# Patient Record
Sex: Male | Born: 1980 | Hispanic: No | Marital: Married | Smoking: Never smoker
Health system: Southern US, Community
[De-identification: ages and names within clinical notes are randomized; demographics above are authoritative.]

## PROBLEM LIST (undated history)

## (undated) DIAGNOSIS — R011 Cardiac murmur, unspecified: Secondary | ICD-10-CM

## (undated) HISTORY — PX: KNEE ARTHROPLASTY: SHX992

## (undated) HISTORY — PX: WRIST SURGERY: SHX841

## (undated) HISTORY — DX: Cardiac murmur, unspecified: R01.1

## (undated) HISTORY — PX: APPENDECTOMY: SHX54

---

## 2009-08-08 ENCOUNTER — Encounter: Admission: RE | Admit: 2009-08-08 | Discharge: 2009-08-08 | Payer: Self-pay | Admitting: Specialist

## 2017-12-16 NOTE — Progress Notes (Signed)
Subjective:    Patient ID: Henry NianCorey Stuart, male    DOB: 01/19/1981, 36 y.o.   MRN: [id number]  HPI:  Henry Stuart is here to establish as a new pt.  He is a pleasant 36 year old male.  PMH: Denies chronic medical conditions/daily medications.  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 He drinks >60 oz water/day and follows heart healthy diet. He has never used tobacco and enjoys ETOH socially.  He sleeps well and feels that his overall health is excellent. Only one acute complaint- clear nasal drainage that has been occurring for 2 days, he denies HA/fever/night sweats/cough  Patient Care Team    Relationship Specialty Notifications Start End  Danford, Jinny BlossomKaty D, NP PCP - General Family Medicine  12/18/17     There are no active problems to display for this patient.    History reviewed. No pertinent past medical history.   Past Surgical History:  Procedure Laterality Date  . KNEE ARTHROPLASTY Left   . WRIST SURGERY Right      Family History  Problem Relation Age of Onset  . Cancer Mother        GYN  . Cancer Father        esophageal  . Healthy Sister   . Healthy Brother   . Healthy Daughter   . Healthy Son   . Healthy Son   . Healthy Son   . Healthy Sister   . Healthy Sister   . Healthy Sister   . Healthy Sister   . Healthy Sister   . Healthy Brother   . Healthy Brother   . Healthy Brother   . Healthy Brother      Social History   Substance and Sexual Activity  Drug Use No     Social History   Substance and Sexual Activity  Alcohol Use Yes  . Alcohol/week: 3.0 oz  . Types: 5 Cans of beer per week     Social History   Tobacco Use  Smoking Status Never Smoker  Smokeless Tobacco Never Used     No outpatient encounter medications on file as of 12/18/2017.   No  facility-administered encounter medications on file as of 12/18/2017.     Allergies: Patient has no known allergies.  Body mass index is 22.9 kg/m.  Blood pressure (!) 99/56, pulse 69, height 6' 6.25" (1.988 m), weight 199 lb 6.4 oz (90.4 kg), SpO2 98 %.      Review of Systems  Constitutional: Positive for fatigue. Negative for activity change, appetite change, chills, diaphoresis, fever and unexpected weight change.  HENT: Positive for congestion and rhinorrhea. Negative for sinus pressure, sinus pain, trouble swallowing and voice change.   Eyes: Negative for visual disturbance.  Respiratory: Negative for cough, chest tightness, shortness of breath, wheezing and stridor.   Cardiovascular: Negative for chest pain, palpitations and leg swelling.  Gastrointestinal: Negative for abdominal distention, abdominal pain, blood in stool, constipation, diarrhea, nausea and vomiting.  Endocrine: Negative for cold intolerance, heat intolerance, polydipsia, polyphagia and polyuria.  Genitourinary: Negative for difficulty urinating, flank pain and hematuria.  Musculoskeletal: Negative for arthralgias, back pain, gait problem, joint swelling, myalgias, neck pain and neck stiffness.  Skin: Negative for color change, pallor, rash and wound.  Neurological: Negative for dizziness, tremors, weakness and headaches.  Hematological:  Does not bruise/bleed easily.  Psychiatric/Behavioral: Negative for decreased concentration, dysphoric mood, self-injury, sleep disturbance and suicidal ideas. The patient is not nervous/anxious.        Objective:   Physical Exam  Constitutional: He is oriented to person, place, and time. He appears well-developed and well-nourished. No distress.  HENT:  Head: Normocephalic and atraumatic.  Right Ear: Hearing, tympanic membrane, external ear and ear canal normal. Tympanic membrane is not erythematous and not bulging. No decreased hearing is noted.  Left Ear: Hearing,  tympanic membrane, external ear and ear canal normal. Tympanic membrane is not erythematous and not bulging. No decreased hearing is noted.  Nose: Mucosal edema and rhinorrhea present. Right sinus exhibits no maxillary sinus tenderness and no frontal sinus tenderness. Left sinus exhibits no maxillary sinus tenderness and no frontal sinus tenderness.  Mouth/Throat: Uvula is midline, oropharynx is clear and moist and mucous membranes are normal.  Eyes: Conjunctivae are normal. Pupils are equal, round, and reactive to light.  Neck: Normal range of motion. Neck supple.  Cardiovascular: Normal rate, regular rhythm, normal heart sounds and intact distal pulses.  No murmur heard. Pulmonary/Chest: Effort normal. No respiratory distress. He has no wheezes. He has no rales. He exhibits no tenderness.  Lymphadenopathy:    He has no cervical adenopathy.  Neurological: He is alert and oriented to person, place, and time. Coordination normal.  Skin: Skin is warm and dry. No rash noted. He is not diaphoretic. No erythema. No pallor.  Psychiatric: He has a normal mood and affect. His behavior is normal. Judgment and thought content normal.  Nursing note and vitals reviewed.         Assessment & Plan:   1. Healthcare maintenance   2. Sinus drainage     Healthcare maintenance Increase water intake, strive for at least 100 oz/day. KEEP DOING EVERYTHING THAT YOU ARE DOING! Annual follow-up with fasting labs.  Sinus drainage OTC Claritin when allergies/sinus drainage flares up.    FOLLOW-UP:  Return in about 1 year (around 12/18/2018) for CPE, Fasting Labs.

## 2017-12-18 ENCOUNTER — Other Ambulatory Visit: Payer: Self-pay | Admitting: Adult Health

## 2017-12-18 ENCOUNTER — Ambulatory Visit (INDEPENDENT_AMBULATORY_CARE_PROVIDER_SITE_OTHER): Payer: BC Managed Care – PPO | Admitting: Adult Health

## 2017-12-18 ENCOUNTER — Encounter: Payer: Self-pay | Admitting: Adult Health

## 2017-12-18 VITALS — BP 99/56 | HR 69 | Ht 78.25 in | Wt 199.4 lb

## 2017-12-18 DIAGNOSIS — Z0000 Encounter for general adult medical examination without abnormal findings: Secondary | ICD-10-CM | POA: Diagnosis not present

## 2017-12-18 DIAGNOSIS — J3489 Other specified disorders of nose and nasal sinuses: Secondary | ICD-10-CM | POA: Insufficient documentation

## 2017-12-18 NOTE — Assessment & Plan Note (Signed)
Increase water intake, strive for at least 100 oz/day. KEEP DOING EVERYTHING THAT YOU ARE DOING! Annual follow-up with fasting labs.

## 2017-12-18 NOTE — Patient Instructions (Signed)
Heart-Healthy Eating Plan Many factors influence your heart health, including eating and exercise habits. Heart (coronary) risk increases with abnormal blood fat (lipid) levels. Heart-healthy meal planning includes limiting unhealthy fats, increasing healthy fats, and making other small dietary changes. This includes maintaining a healthy body weight to help keep lipid levels within a normal range. What is my plan? Your health care provider recommends that you:  Get no more than ___25___% of the total calories in your daily diet from fat.  Limit your intake of saturated fat to less than ____5___% of your total calories each day.  Limit the amount of cholesterol in your diet to less than ___300___ mg per day.  What types of fat should I choose?  Choose healthy fats more often. Choose monounsaturated and polyunsaturated fats, such as olive oil and canola oil, flaxseeds, walnuts, almonds, and seeds.  Eat more omega-3 fats. Good choices include salmon, mackerel, sardines, tuna, flaxseed oil, and ground flaxseeds. Aim to eat fish at least two times each week.  Limit saturated fats. Saturated fats are primarily found in animal products, such as meats, butter, and cream. Plant sources of saturated fats include palm oil, palm kernel oil, and coconut oil.  Avoid foods with partially hydrogenated oils in them. These contain trans fats. Examples of foods that contain trans fats are stick margarine, some tub margarines, cookies, crackers, and other baked goods. What general guidelines do I need to follow?  Check food labels carefully to identify foods with trans fats or high amounts of saturated fat.  Fill one half of your plate with vegetables and green salads. Eat 4-5 servings of vegetables per day. A serving of vegetables equals 1 cup of raw leafy vegetables,  cup of raw or cooked cut-up vegetables, or  cup of vegetable juice.  Fill one fourth of your plate with whole grains. Look for the word  "whole" as the first word in the ingredient list.  Fill one fourth of your plate with lean protein foods.  Eat 4-5 servings of fruit per day. A serving of fruit equals one medium whole fruit,  cup of dried fruit,  cup of fresh, frozen, or canned fruit, or  cup of 100% fruit juice.  Eat more foods that contain soluble fiber. Examples of foods that contain this type of fiber are apples, broccoli, carrots, beans, peas, and barley. Aim to get 20-30 g of fiber per day.  Eat more home-cooked food and less restaurant, buffet, and fast food.  Limit or avoid alcohol.  Limit foods that are high in starch and sugar.  Avoid fried foods.  Cook foods by using methods other than frying. Baking, boiling, grilling, and broiling are all great options. Other fat-reducing suggestions include: ? Removing the skin from poultry. ? Removing all visible fats from meats. ? Skimming the fat off of stews, soups, and gravies before serving them. ? Steaming vegetables in water or broth.  Lose weight if you are overweight. Losing just 5-10% of your initial body weight can help your overall health and prevent diseases such as diabetes and heart disease.  Increase your consumption of nuts, legumes, and seeds to 4-5 servings per week. One serving of dried beans or legumes equals  cup after being cooked, one serving of nuts equals 1 ounces, and one serving of seeds equals  ounce or 1 tablespoon.  You may need to monitor your salt (sodium) intake, especially if you have high blood pressure. Talk with your health care provider or dietitian to get  more information about reducing sodium. What foods can I eat? Grains  Breads, including French, white, pita, wheat, raisin, rye, oatmeal, and Italian. Tortillas that are neither fried nor made with lard or trans fat. Low-fat rolls, including hotdog and hamburger buns and English muffins. Biscuits. Muffins. Waffles. Pancakes. Light popcorn. Whole-grain cereals. Flatbread.  Melba toast. Pretzels. Breadsticks. Rusks. Low-fat snacks and crackers, including oyster, saltine, matzo, graham, animal, and rye. Rice and pasta, including brown rice and those that are made with whole wheat. Vegetables All vegetables. Fruits All fruits, but limit coconut. Meats and Other Protein Sources Lean, well-trimmed beef, veal, pork, and lamb. Chicken and turkey without skin. All fish and shellfish. Wild duck, rabbit, pheasant, and venison. Egg whites or low-cholesterol egg substitutes. Dried beans, peas, lentils, and tofu.Seeds and most nuts. Dairy Low-fat or nonfat cheeses, including ricotta, string, and mozzarella. Skim or 1% milk that is liquid, powdered, or evaporated. Buttermilk that is made with low-fat milk. Nonfat or low-fat yogurt. Beverages Mineral water. Diet carbonated beverages. Sweets and Desserts Sherbets and fruit ices. Honey, jam, marmalade, jelly, and syrups. Meringues and gelatins. Pure sugar candy, such as hard candy, jelly beans, gumdrops, mints, marshmallows, and small amounts of dark chocolate. Angel food cake. Eat all sweets and desserts in moderation. Fats and Oils Nonhydrogenated (trans-free) margarines. Vegetable oils, including soybean, sesame, sunflower, olive, peanut, safflower, corn, canola, and cottonseed. Salad dressings or mayonnaise that are made with a vegetable oil. Limit added fats and oils that you use for cooking, baking, salads, and as spreads. Other Cocoa powder. Coffee and tea. All seasonings and condiments. The items listed above may not be a complete list of recommended foods or beverages. Contact your dietitian for more options. What foods are not recommended? Grains Breads that are made with saturated or trans fats, oils, or whole milk. Croissants. Butter rolls. Cheese breads. Sweet rolls. Donuts. Buttered popcorn. Chow mein noodles. High-fat crackers, such as cheese or butter crackers. Meats and Other Protein Sources Fatty meats, such  as hotdogs, short ribs, sausage, spareribs, bacon, ribeye roast or steak, and mutton. High-fat deli meats, such as salami and bologna. Caviar. Domestic duck and goose. Organ meats, such as kidney, liver, sweetbreads, brains, gizzard, chitterlings, and heart. Dairy Cream, sour cream, cream cheese, and creamed cottage cheese. Whole milk cheeses, including blue (bleu), Monterey Jack, Brie, Colby, American, Havarti, Swiss, cheddar, Camembert, and [place]. Whole or 2% milk that is liquid, evaporated, or condensed. Whole buttermilk. Cream sauce or high-fat cheese sauce. Yogurt that is made from whole milk. Beverages Regular sodas and drinks with added sugar. Sweets and Desserts Frosting. Pudding. Cookies. Cakes other than angel food cake. Candy that has milk chocolate or white chocolate, hydrogenated fat, butter, coconut, or unknown ingredients. Buttered syrups. Full-fat ice cream or ice cream drinks. Fats and Oils Gravy that has suet, meat fat, or shortening. Cocoa butter, hydrogenated oils, palm oil, coconut oil, palm kernel oil. These can often be found in baked products, candy, fried foods, nondairy creamers, and whipped toppings. Solid fats and shortenings, including bacon fat, salt pork, lard, and butter. Nondairy cream substitutes, such as coffee creamers and sour cream substitutes. Salad dressings that are made of unknown oils, cheese, or sour cream. The items listed above may not be a complete list of foods and beverages to avoid. Contact your dietitian for more information. This information is not intended to replace advice given to you by your health care provider. Make sure you discuss any questions you have with your health care   provider. Document Released: 09/25/2008 Document Revised: 07/06/2016 Document Reviewed: 06/10/2014 Elsevier Interactive Patient Education  2018 [employer]   Increase water intake, strive for at least 100 oz/day. KEEP DOING EVERYTHING THAT YOU ARE DOING! OTC  Claritin when allergies/sinus drainage flares up. Annual follow-up with fasting labs. WELCOME TO THE PRACTICE!

## 2017-12-18 NOTE — Assessment & Plan Note (Signed)
OTC Claritin when allergies/sinus drainage flares up.

## 2018-01-15 ENCOUNTER — Other Ambulatory Visit: Payer: BC Managed Care – PPO

## 2018-01-15 DIAGNOSIS — Z0000 Encounter for general adult medical examination without abnormal findings: Secondary | ICD-10-CM

## 2018-01-16 LAB — COMPREHENSIVE METABOLIC PANEL
A/G RATIO: 1.7 (ref 1.2–2.2)
ALBUMIN: 4.5 g/dL (ref 3.5–5.5)
ALT: 10 IU/L (ref 0–44)
AST: 19 IU/L (ref 0–40)
Alkaline Phosphatase: 49 IU/L (ref 39–117)
BUN / CREAT RATIO: 9 (ref 9–20)
BUN: 13 mg/dL (ref 6–20)
Bilirubin Total: 0.6 mg/dL (ref 0.0–1.2)
CALCIUM: 9.6 mg/dL (ref 8.7–10.2)
CO2: 27 mmol/L (ref 20–29)
Chloride: 100 mmol/L (ref 96–106)
Creatinine, Ser: 1.46 mg/dL — ABNORMAL HIGH (ref 0.76–1.27)
GFR calc Af Amer: 71 mL/min/{1.73_m2} (ref >59)
GFR, EST NON AFRICAN AMERICAN: 61 mL/min/{1.73_m2} (ref >59)
GLOBULIN, TOTAL: 2.7 g/dL (ref 1.5–4.5)
Glucose: 93 mg/dL (ref 65–99)
POTASSIUM: 4.3 mmol/L (ref 3.5–5.2)
SODIUM: 142 mmol/L (ref 134–144)
TOTAL PROTEIN: 7.2 g/dL (ref 6.0–8.5)

## 2018-01-16 LAB — CBC WITH DIFFERENTIAL/PLATELET
Basophils Absolute: 0 10*3/uL (ref 0.0–0.2)
Basos: 0 %
EOS (ABSOLUTE): 0 10*3/uL (ref 0.0–0.4)
Eos: 1 %
HEMATOCRIT: 38.3 % (ref 37.5–51.0)
HEMOGLOBIN: 12.9 g/dL — AB (ref 13.0–17.7)
IMMATURE GRANS (ABS): 0 10*3/uL (ref 0.0–0.1)
Immature Granulocytes: 0 %
LYMPHS ABS: 2.3 10*3/uL (ref 0.7–3.1)
Lymphs: 36 %
MCH: 30.5 pg (ref 26.6–33.0)
MCHC: 33.7 g/dL (ref 31.5–35.7)
MCV: 91 fL (ref 79–97)
MONOCYTES: 6 %
Monocytes Absolute: 0.4 10*3/uL (ref 0.1–0.9)
Neutrophils Absolute: 3.6 10*3/uL (ref 1.4–7.0)
Neutrophils: 57 %
Platelets: 234 10*3/uL (ref 150–379)
RBC: 4.23 x10E6/uL (ref 4.14–5.80)
RDW: 13.2 % (ref 12.3–15.4)
WBC: 6.3 10*3/uL (ref 3.4–10.8)

## 2018-01-16 LAB — LIPID PANEL
CHOLESTEROL TOTAL: 143 mg/dL (ref 100–199)
Chol/HDL Ratio: 2.3 ratio (ref 0.0–5.0)
HDL: 61 mg/dL (ref >39)
LDL CALC: 72 mg/dL (ref 0–99)
TRIGLYCERIDES: 52 mg/dL (ref 0–149)
VLDL Cholesterol Cal: 10 mg/dL (ref 5–40)

## 2018-01-16 LAB — HEMOGLOBIN A1C
Est. average glucose Bld gHb Est-mCnc: 94 mg/dL
Hgb A1c MFr Bld: 4.9 % (ref 4.8–5.6)

## 2018-01-16 LAB — TSH: TSH: 4.47 u[IU]/mL (ref 0.450–4.500)

## 2018-01-16 LAB — VITAMIN D 25 HYDROXY (VIT D DEFICIENCY, FRACTURES): Vit D, 25-Hydroxy: 11.6 ng/mL — ABNORMAL LOW (ref 30.0–100.0)

## 2018-02-10 ENCOUNTER — Other Ambulatory Visit: Payer: Self-pay | Admitting: Adult Health

## 2018-02-10 ENCOUNTER — Telehealth: Payer: Self-pay | Admitting: Adult Health

## 2018-02-10 MED ORDER — VITAMIN D (ERGOCALCIFEROL) 1.25 MG (50000 UNIT) PO CAPS: 50000.0000 [IU] | ORAL_CAPSULE | ORAL | 0 refills | Status: DC

## 2018-02-10 NOTE — Telephone Encounter (Signed)
Patient was inquiring about Vit D prescription that should have been sent in. If approved please send to [facility] [address]

## 2018-02-10 NOTE — Telephone Encounter (Signed)
Sent in Thanks! Henry Stuart  

## 2018-03-13 ENCOUNTER — Encounter: Payer: Self-pay | Admitting: Adult Health

## 2018-03-13 ENCOUNTER — Ambulatory Visit: Payer: BC Managed Care – PPO | Admitting: Adult Health

## 2018-03-13 VITALS — BP 110/67 | HR 84 | Temp 98.0°F | Ht 78.25 in | Wt 198.7 lb

## 2018-03-13 DIAGNOSIS — H6691 Otitis media, unspecified, right ear: Secondary | ICD-10-CM | POA: Diagnosis not present

## 2018-03-13 MED ORDER — AMOXICILLIN-POT CLAVULANATE 875-125 MG PO TABS: 1.0000 | ORAL_TABLET | Freq: Two times a day (BID) | ORAL | 0 refills | Status: DC

## 2018-03-13 NOTE — Progress Notes (Signed)
Subjective:    Patient ID: Henry Stuart, male    DOB: 01-29-1981, 37 y.o.   MRN: [id number]  HPI:  Henry Stuart presents with R ear pain 6/10 that is constant and described as "aching, stabbing" that started 3 weeks ago.  He reports cleaning his ears with Qtip about 4 months ago and "punctured my R ear"- blood oozed out for several days. He denies nasal drainage, fever, CP, dyspnea, palpitations. He denies decreased hearing in R ear He also reports mild R jaw tenderness with, especially with mastication. He denies hx of lacerations TDaP- UTD  Patient Care Team    Relationship Specialty Notifications Start End  Julaine Fusianford, Emon Lance D, NP PCP - General Family Medicine  12/18/17     Patient Active Problem List   Diagnosis Date Noted  . Right otitis media 03/13/2018  . Healthcare maintenance 12/18/2017  . Sinus drainage 12/18/2017     History reviewed. No pertinent past medical history.   Past Surgical History:  Procedure Laterality Date  . KNEE ARTHROPLASTY Left   . WRIST SURGERY Right      Family History  Problem Relation Age of Onset  . Cancer Mother        GYN  . Cancer Father        esophageal  . Healthy Sister   . Healthy Brother   . Healthy Daughter   . Healthy Son   . Healthy Son   . Healthy Son   . Healthy Sister   . Healthy Sister   . Healthy Sister   . Healthy Sister   . Healthy Sister   . Healthy Brother   . Healthy Brother   . Healthy Brother   . Healthy Brother      Social History   Substance and Sexual Activity  Drug Use No     Social History   Substance and Sexual Activity  Alcohol Use Yes  . Alcohol/week: 3.0 oz  . Types: 5 Cans of beer per week     Social History   Tobacco Use  Smoking Status Never Smoker  Smokeless Tobacco Never Used     Outpatient Encounter Medications as of 03/13/2018  Medication Sig  . Vitamin D, Ergocalciferol, (DRISDOL) 50000 units CAPS capsule Take 1 capsule (50,000 Units total) by mouth every 7  (seven) days.  Marland Kitchen. amoxicillin-clavulanate (AUGMENTIN) 875-125 MG tablet Take 1 tablet by mouth 2 (two) times daily.   No facility-administered encounter medications on file as of 03/13/2018.     Allergies: Patient has no known allergies.  Body mass index is 22.82 kg/m.  Blood pressure 110/67, pulse 84, temperature 98 F (36.7 C), temperature source Oral, height 6' 6.25" (1.988 m), weight 198 lb 11.2 oz (90.1 kg), SpO2 99 %.     Review of Systems  Constitutional: Positive for fatigue. Negative for activity change, appetite change, chills, diaphoresis, fever and unexpected weight change.  HENT: Positive for ear discharge and ear pain. Negative for congestion, facial swelling, nosebleeds, postnasal drip, rhinorrhea, sinus pressure, sinus pain, tinnitus, trouble swallowing and voice change.   Eyes: Negative for visual disturbance.  Respiratory: Negative for cough, chest tightness, shortness of breath, wheezing and stridor.   Cardiovascular: Negative for chest pain, palpitations and leg swelling.  Neurological: Negative for dizziness and headaches.  Hematological: Does not bruise/bleed easily.       Objective:   Physical Exam  Constitutional: He is oriented to person, place, and time. He appears well-developed and well-nourished. No distress.  HENT:  Head: Normocephalic and atraumatic.  Right Ear: Hearing, external ear and ear canal normal. Right ear exhibits lacerations. Tympanic membrane is erythematous and bulging. No decreased hearing is noted.  Left Ear: Hearing, tympanic membrane, external ear and ear canal normal. Tympanic membrane is not erythematous and not bulging. No decreased hearing is noted.  Ears:  Healing laceration No purulent drainage noted  Eyes: Conjunctivae are normal. Pupils are equal, round, and reactive to light.  Neck: Normal range of motion. Neck supple.  Cardiovascular: Normal rate, regular rhythm, normal heart sounds and intact distal pulses.  No murmur  heard. Pulmonary/Chest: Effort normal and breath sounds normal. No respiratory distress. He has no wheezes. He has no rales. He exhibits no tenderness.  Lymphadenopathy:    He has no cervical adenopathy.  Neurological: He is alert and oriented to person, place, and time.  Skin: Skin is warm and dry. No rash noted. He is not diaphoretic. No erythema. No pallor.  Psychiatric: He has a normal mood and affect. His behavior is normal. Judgment and thought content normal.  Nursing note and vitals reviewed.         Assessment & Plan:   1. Right otitis media, unspecified otitis media type     Right otitis media Please take Augmentin as directed. Alternate OTC Acetaminophen and Ibuprofen for pain. If symptoms persist after Augmentin completed, then please call clinic. Do not submerge head under water for a few weeks.    FOLLOW-UP:  Return if symptoms worsen or fail to improve.

## 2018-03-13 NOTE — Assessment & Plan Note (Addendum)
Please take Augmentin as directed. Alternate OTC Acetaminophen and Ibuprofen for pain. If symptoms persist after Augmentin completed, then please call clinic. Do not submerge head under water for a few weeks.

## 2018-03-13 NOTE — Patient Instructions (Addendum)
Otitis Media, Adult Otitis media occurs when there is inflammation and fluid in the middle ear. Your middle ear is a part of the ear that contains bones for hearing as well as air that helps send sounds to your brain. What are the causes? This condition is caused by a blockage in the eustachian tube. This tube drains fluid from the ear to the back of the nose (nasopharynx). A blockage in this tube can be caused by an object or by swelling (edema) in the tube. Problems that can cause a blockage include:  A cold or other upper respiratory infection.  Allergies.  An irritant, such as tobacco smoke.  Enlarged adenoids. The adenoids are areas of soft tissue located high in the back of the throat, behind the nose and the roof of the mouth.  A mass in the nasopharynx.  Damage to the ear caused by pressure changes (barotrauma). What are the signs or symptoms? Symptoms of this condition include:  Ear pain.  A fever.  Decreased hearing.  A headache.  Tiredness (lethargy).  Fluid leaking from the ear.  Ringing in the ear. How is this diagnosed? This condition is diagnosed with a physical exam. During the exam your health care provider will use an instrument called an otoscope to look into your ear and check for redness, swelling, and fluid. He or she will also ask about your symptoms. Your health care provider may also order tests, such as:  A test to check the movement of the eardrum (pneumatic otoscopy). This test is done by squeezing a small amount of air into the ear.  A test that changes air pressure in the middle ear to check how well the eardrum moves and whether the eustachian tube is working (tympanogram). How is this treated? This condition usually goes away on its own within 3-5 days. But if the condition is caused by a bacteria infection and does not go away own its own, or keeps coming back, your health care provider may:  Prescribe antibiotic medicines to treat the  infection.  Prescribe or recommend medicines to control pain. Follow these instructions at home:  Take over-the-counter and prescription medicines only as told by your health care provider.  If you were prescribed an antibiotic medicine, take it as told by your health care provider. Do not stop taking the antibiotic even if you start to feel better.  Keep all follow-up visits as told by your health care provider. This is important. Contact a health care provider if:  You have bleeding from your nose.  There is a lump on your neck.  You are not getting better in 5 days.  You feel worse instead of better. Get help right away if:  You have severe pain that is not controlled with medicine.  You have swelling, redness, or pain around your ear.  You have stiffness in your neck.  A part of your face is paralyzed.  The bone behind your ear (mastoid) is tender when you touch it.  You develop a severe headache. Summary  Otitis media is redness, soreness, and swelling of the middle ear.  This condition usually goes away on its own within 3-5 days.  If the problem does not go away in 3-5 days, your health care provider may prescribe or recommend medicines to treat your symptoms.  If you were prescribed an antibiotic medicine, take it as told by your health care provider. This information is not intended to replace advice given to you by your   to you by your health care provider. Make sure you discuss any questions you have with your health care provider. Document Released: 09/21/2004 Document Revised: 12/07/2016 Document Reviewed: 12/07/2016 Elsevier Interactive Patient Education  2018 [employer]   Please take Augmentin as directed. Alternate OTC Acetaminophen and Ibuprofen for pain. If symptoms persist after Augmentin completed, then please call clinic. Do not submerge head under water for a few weeks. FEEL BETTER!

## 2018-03-27 ENCOUNTER — Encounter: Payer: Self-pay | Admitting: Adult Health

## 2018-03-27 ENCOUNTER — Ambulatory Visit (INDEPENDENT_AMBULATORY_CARE_PROVIDER_SITE_OTHER): Payer: BC Managed Care – PPO | Admitting: Adult Health

## 2018-03-27 VITALS — BP 97/61 | HR 54 | Temp 97.7°F | Ht 78.25 in | Wt 201.4 lb

## 2018-03-27 DIAGNOSIS — R6884 Jaw pain: Secondary | ICD-10-CM

## 2018-03-27 DIAGNOSIS — M26629 Arthralgia of temporomandibular joint, unspecified side: Secondary | ICD-10-CM | POA: Insufficient documentation

## 2018-03-27 DIAGNOSIS — H6691 Otitis media, unspecified, right ear: Secondary | ICD-10-CM | POA: Diagnosis not present

## 2018-03-27 NOTE — Patient Instructions (Addendum)
Temporomandibular Joint Syndrome Temporomandibular joint (TMJ) syndrome is a condition that affects the joints between your jaw and your skull. The TMJs are located near your ears and allow your jaw to open and close. These joints and the nearby muscles are involved in all movements of the jaw. People with TMJ syndrome have pain in the area of these joints and muscles. Chewing, biting, or other movements of the jaw can be difficult or painful. TMJ syndrome can be caused by various things. In many cases, the condition is mild and goes away within a few weeks. For some people, the condition can become a long-term problem. What are the causes? Possible causes of TMJ syndrome include:  Grinding your teeth or clenching your jaw. Some people do this when they are under stress.  Arthritis.  Injury to the jaw.  Head or neck injury.  Teeth or dentures that are not aligned well.  In some cases, the cause of TMJ syndrome may not be known. What are the signs or symptoms? The most common symptom is an aching pain on the side of the head in the area of the TMJ. Other symptoms may include:  Pain when moving your jaw, such as when chewing or biting.  Being unable to open your jaw all the way.  Making a clicking sound when you open your mouth.  Headache.  Earache.  Neck or shoulder pain.  How is this diagnosed? Diagnosis can usually be made based on your symptoms, your medical history, and a physical exam. Your health care provider may check the range of motion of your jaw. Imaging tests, such as X-rays or an MRI, are sometimes done. You may need to see your dentist to determine if your teeth and jaw are lined up correctly. How is this treated? TMJ syndrome often goes away on its own. If treatment is needed, the options may include:  Eating soft foods and applying ice or heat.  Medicines to relieve pain or inflammation.  Medicines to relax the muscles.  A splint, bite plate, or mouthpiece  to prevent teeth grinding or jaw clenching.  Relaxation techniques or counseling to help reduce stress.  Transcutaneous electrical nerve stimulation (TENS). This helps to relieve pain by applying an electrical current through the skin.  Acupuncture. This is sometimes helpful to relieve pain.  Jaw surgery. This is rarely needed.  Follow these instructions at home:  Take medicines only as directed by your health care provider.  Eat a soft diet if you are having trouble chewing.  Apply ice to the painful area. ? Put ice in a plastic bag. ? Place a towel between your skin and the bag. ? Leave the ice on for 20 minutes, 2-3 times a day.  Apply a warm compress to the painful area as directed.  Massage your jaw area and perform any jaw stretching exercises as recommended by your health care provider.  If you were given a mouthpiece or bite plate, wear it as directed.  Avoid foods that require a lot of chewing. Do not chew gum.  Keep all follow-up visits as directed by your health care provider. This is important. Contact a health care provider if:  You are having trouble eating.  You have new or worsening symptoms. Get help right away if:  Your jaw locks open or closed. This information is not intended to replace advice given to you by your health care provider. Make sure you discuss any questions you have with your health care provider. Document   Released: 09/11/2001 Document Revised: 08/16/2016 Document Reviewed: 07/22/2014 Elsevier Interactive Patient Education  2018 [employer]  Recommend alternating Acetaminophen and Ibuprofen for pain control. Continue to avoid q tip use. Ultrasound order and referral to ENT placed. Please call clinic with any questions/concerns. FEEL BETTER!

## 2018-03-27 NOTE — Assessment & Plan Note (Signed)
>>  ASSESSMENT AND PLAN FOR PAIN IN UPPER JAW WRITTEN ON 03/27/2018  1:56 PM BY DANFORD, KATY D, NP  Poss TMJ Follow care instructions and US soft tissue head/neck placed Referral to ENT placed

## 2018-03-27 NOTE — Assessment & Plan Note (Signed)
He completed course of ABX Continue to avoid q tip use Referral to ENT placed
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
## 2018-03-27 NOTE — Assessment & Plan Note (Signed)
Poss TMJ Follow care instructions and US soft tissue head/neck placed Referral to ENT placed

## 2018-04-01 ENCOUNTER — Other Ambulatory Visit: Payer: BC Managed Care – PPO

## 2018-04-01 DIAGNOSIS — M26629 Arthralgia of temporomandibular joint, unspecified side: Secondary | ICD-10-CM | POA: Insufficient documentation

## 2018-05-10 ENCOUNTER — Other Ambulatory Visit: Payer: Self-pay | Admitting: Adult Health

## 2018-05-12 ENCOUNTER — Telehealth: Payer: Self-pay

## 2018-05-12 NOTE — Telephone Encounter (Signed)
LVM for pt that he needs repeat labs prior to refills of Vitamin D2.  Tiajuana Amass, CMA

## 2018-09-29 ENCOUNTER — Ambulatory Visit: Payer: BC Managed Care – PPO

## 2018-09-29 ENCOUNTER — Encounter: Payer: Self-pay | Admitting: Adult Health

## 2018-09-29 ENCOUNTER — Ambulatory Visit (INDEPENDENT_AMBULATORY_CARE_PROVIDER_SITE_OTHER): Payer: BC Managed Care – PPO | Admitting: Adult Health

## 2018-09-29 VITALS — BP 115/75 | HR 77 | Ht 78.25 in | Wt 202.2 lb

## 2018-09-29 DIAGNOSIS — M25412 Effusion, left shoulder: Secondary | ICD-10-CM | POA: Insufficient documentation

## 2018-09-29 DIAGNOSIS — T148XXA Other injury of unspecified body region, initial encounter: Secondary | ICD-10-CM | POA: Insufficient documentation

## 2018-09-29 DIAGNOSIS — M25512 Pain in left shoulder: Principal | ICD-10-CM

## 2018-09-29 DIAGNOSIS — Z23 Encounter for immunization: Secondary | ICD-10-CM | POA: Diagnosis not present

## 2018-09-29 MED ORDER — CEPHALEXIN 500 MG PO CAPS: 500.0000 mg | ORAL_CAPSULE | Freq: Two times a day (BID) | ORAL | 0 refills | Status: DC
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
## 2018-09-29 NOTE — Progress Notes (Signed)
tdap

## 2018-09-29 NOTE — Assessment & Plan Note (Addendum)
Xray L shoulder complete- IMPRESSION: Normal exam. OTC Ibuprofen 800mg  Q8H with food Apply ice to shoulder for 20 mins several times daily Limit use for several weeks until swelling/pain resolve

## 2018-09-29 NOTE — Assessment & Plan Note (Addendum)
Abrasion of multiple area's- all cleaned, antibiotic cream applied, wounds dressed. Cephalexin 500mg  BID x 10 days Tdap updated in office, pt tolerated well

## 2018-09-29 NOTE — Patient Instructions (Signed)

## 2018-12-12 NOTE — Progress Notes (Deleted)
   Subjective:    Patient ID: Henry Stuart, male    DOB: [birth year], 37 y.o.   MRN: [id number]  HPI:  Mr. Park LiterMuirhead presents for CPE  Healthcare Maintenance: Immunizations-  Patient Care Team    Relationship Specialty Notifications Start End  Julaine Fusianford, Jalen Oberry D, NP PCP - General Family Medicine  12/18/17     Patient Active Problem List   Diagnosis Date Noted  . Abrasion 09/29/2018  . Pain and swelling of left shoulder 09/29/2018  . Pain in upper jaw 03/27/2018  . Right otitis media 03/13/2018  . Healthcare maintenance 12/18/2017  . Sinus drainage 12/18/2017     No past medical history on file.   Past Surgical History:  Procedure Laterality Date  . KNEE ARTHROPLASTY Left   . WRIST SURGERY Right      Family History  Problem Relation Age of Onset  . Cancer Mother        GYN  . Cancer Father        esophageal  . Healthy Sister   . Healthy Brother   . Healthy Daughter   . Healthy Son   . Healthy Son   . Healthy Son   . Healthy Sister   . Healthy Sister   . Healthy Sister   . Healthy Sister   . Healthy Sister   . Healthy Brother   . Healthy Brother   . Healthy Brother   . Healthy Brother      Social History   Substance and Sexual Activity  Drug Use No     Social History   Substance and Sexual Activity  Alcohol Use Yes  . Alcohol/week: 5.0 standard drinks  . Types: 5 Cans of beer per week     Social History   Tobacco Use  Smoking Status Never Smoker  Smokeless Tobacco Never Used     Outpatient Encounter Medications as of 12/16/2018  Medication Sig  . cephALEXin (KEFLEX) 500 MG capsule Take 1 capsule (500 mg total) by mouth 2 (two) times daily.   No facility-administered encounter medications on file as of 12/16/2018.     Allergies: Patient has no known allergies.  There is no height or weight on file to calculate BMI.  There were no vitals taken for this visit.     Review of Systems     Objective:   Physical Exam       Assessment & Plan:  No diagnosis found.  No problem-specific Assessment & Plan notes found for this encounter.    FOLLOW-UP:  No follow-ups on file.

## 2018-12-15 ENCOUNTER — Other Ambulatory Visit: Payer: Self-pay

## 2018-12-15 DIAGNOSIS — Z0000 Encounter for general adult medical examination without abnormal findings: Secondary | ICD-10-CM

## 2018-12-16 ENCOUNTER — Encounter: Payer: BC Managed Care – PPO | Admitting: Adult Health

## 2021-09-25 ENCOUNTER — Ambulatory Visit: Payer: Self-pay

## 2021-09-25 ENCOUNTER — Encounter: Payer: Self-pay | Admitting: Family Medicine

## 2021-09-25 ENCOUNTER — Ambulatory Visit (INDEPENDENT_AMBULATORY_CARE_PROVIDER_SITE_OTHER): Payer: BC Managed Care – PPO

## 2021-09-25 ENCOUNTER — Other Ambulatory Visit: Payer: Self-pay

## 2021-09-25 ENCOUNTER — Ambulatory Visit: Payer: BC Managed Care – PPO | Admitting: Family Medicine

## 2021-09-25 VITALS — BP 100/62 | HR 77 | Ht 78.25 in | Wt 196.6 lb

## 2021-09-25 DIAGNOSIS — M79641 Pain in right hand: Secondary | ICD-10-CM

## 2021-09-25 DIAGNOSIS — M79644 Pain in right finger(s): Secondary | ICD-10-CM

## 2021-09-25 NOTE — Patient Instructions (Signed)
Nice to meet you today.  I've referred you to hand PT at [facility].  Please let us know if you've not heard from them in one week regarding scheduling.  Please use Voltaren gel (Generic Diclofenac Gel) up to 4x daily for pain as needed.  This is available over-the-counter as both the name brand Voltaren gel and the generic diclofenac gel.   Follow-up in one month.

## 2021-09-25 NOTE — Progress Notes (Signed)
Subjective:    CC: R 3rd finger pain  I, Henry Stuart, LAT, ATC, am serving as scribe for Dr. Clementeen Stuart.  HPI: Pt is a 40 y/o male presenting w/ c/o R 3rd finger pain since last Wednesday, 09/20/21, when he injured it while playing basketball.  He locates his pain to his R 3rd MCP and R 3rd finger PIP joint.  R finger swelling: yes Aggravating factors: R hand and finger flexion; gripping; R middle finger movement M/L Treatments tried: ice  Pertinent review of Systems: No fevers or chills  Relevant historical information: Prior wrist surgery right side.   Objective:    Vitals:   09/25/21 1017  BP: 100/62  Pulse: 77  SpO2: 96%   General: Well Developed, well nourished, and in no acute distress.   MSK: Right hand swollen and third MCP and PIP. Decreased motion MCP and PIP with pain. Pulses cap refill and sensation are intact distally.  Lab and Radiology Results  Diagnostic Limited MSK Ultrasound of: Right hand third MCP and PIP Third MCP: Mild effusion no acute fractures.  Intact flexor and extensor tendons. Third PIP.  Mild effusion no acute fractures.  Intact flexor and extensor tendons. Impression: Acute fractures.  Mild joint effusion third MCP and PIP.  X-ray images right hand obtained today personally and independently interpreted. No acute fractures visible. Evidence of prior surgery wrist present. Await formal radiology review   Impression and Recommendations:    Assessment and Plan: 40 y.o. male with injury right hand to the third PIP and MCP.  Thought to be traumatic synovitis.  This is effectively a badly jammed finger per my interpretation of x-ray and ultrasound. 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  This is a lot worse than his usual.  I think he is going to have a hard time recovering on his own.  We will immobilize the PIP and MCP with buddy tape and dorsal AlumaFoam  splint and referred to hand PT.  Also recommend oral max dose NSAID and topical Voltaren gel NSAIDs.  Recheck in 1 month.  PDMP not reviewed this encounter. Orders Placed This Encounter  Procedures   [place] LIMITED JOINT SPACE STRUCTURES UP RIGHT(NO LINKED CHARGES)    Order Specific Question:   Reason for Exam (SYMPTOM  OR DIAGNOSIS REQUIRED)    Answer:   R hand pain    Order Specific Question:   Preferred imaging location?    Answer:   [facility] Hand Complete Right    Standing Status:   Future    Number of Occurrences:   1    Standing Expiration Date:   10/25/2021    Order Specific Question:   Reason for Exam (SYMPTOM  OR DIAGNOSIS REQUIRED)    Answer:   R hand pain    Order Specific Question:   Preferred imaging location?    Answer:   Henry Stuart   Ambulatory referral to Physical Therapy    Referral Priority:   Routine    Referral Type:   Physical Medicine    Referral Reason:   Specialty Services Required    Requested Specialty:   Physical Therapy    Number of Visits Requested:   1   No orders of the defined types were placed in this encounter.   Discussed warning signs or symptoms. Please see discharge instructions. Patient expresses understanding.   The above  documentation has been reviewed and is accurate and complete Henry Stuart, M.D.

## 2021-09-26 NOTE — Progress Notes (Signed)
No fracture seen at the middle finger.  It looks like there is an old injury at the ring finger.  We can also see evidence of prior wrist surgery.

## 2021-10-23 NOTE — Progress Notes (Deleted)
   I, Christoper Fabian, LAT, ATC, am serving as scribe for Dr. Clementeen Graham.  Henry Stuart is a 40 y.o. male who presents to [facility] at [facility] today for f/u of R 3rd finger pain that he injured while playing basketball on 09/20/21.  He was last seen by Dr. Denyse Amass on 09/25/21 and was placed in an alumafoam splint and advised to buddy tape his finger. He was also referred to hand therapy at [facility].  Today, pt reports   Diagnostic testing: R hand XR- 09/25/21  Pertinent review of systems: ***  Relevant historical information: ***   Exam:  There were no vitals taken for this visit. General: Well Developed, well nourished, and in no acute distress.   MSK: ***    Lab and Radiology Results No results found for this or any previous visit (from the past 72 hour(s)). No results found.     Assessment and Plan: 40 y.o. male with ***   PDMP not reviewed this encounter. No orders of the defined types were placed in this encounter.  No orders of the defined types were placed in this encounter.    Discussed warning signs or symptoms. Please see discharge instructions. Patient expresses understanding.   ***

## 2021-10-24 ENCOUNTER — Ambulatory Visit: Payer: Self-pay | Admitting: Family Medicine

## 2022-04-21 IMAGING — DX DG HAND COMPLETE 3+V*R*
3 series · 3 of 3 positions shown · non-contrast
Comparison: No prior.

CLINICAL DATA: Right hand pain after playing basketball.

EXAM:
RIGHT HAND - COMPLETE 3+ VIEW

[hand ap]
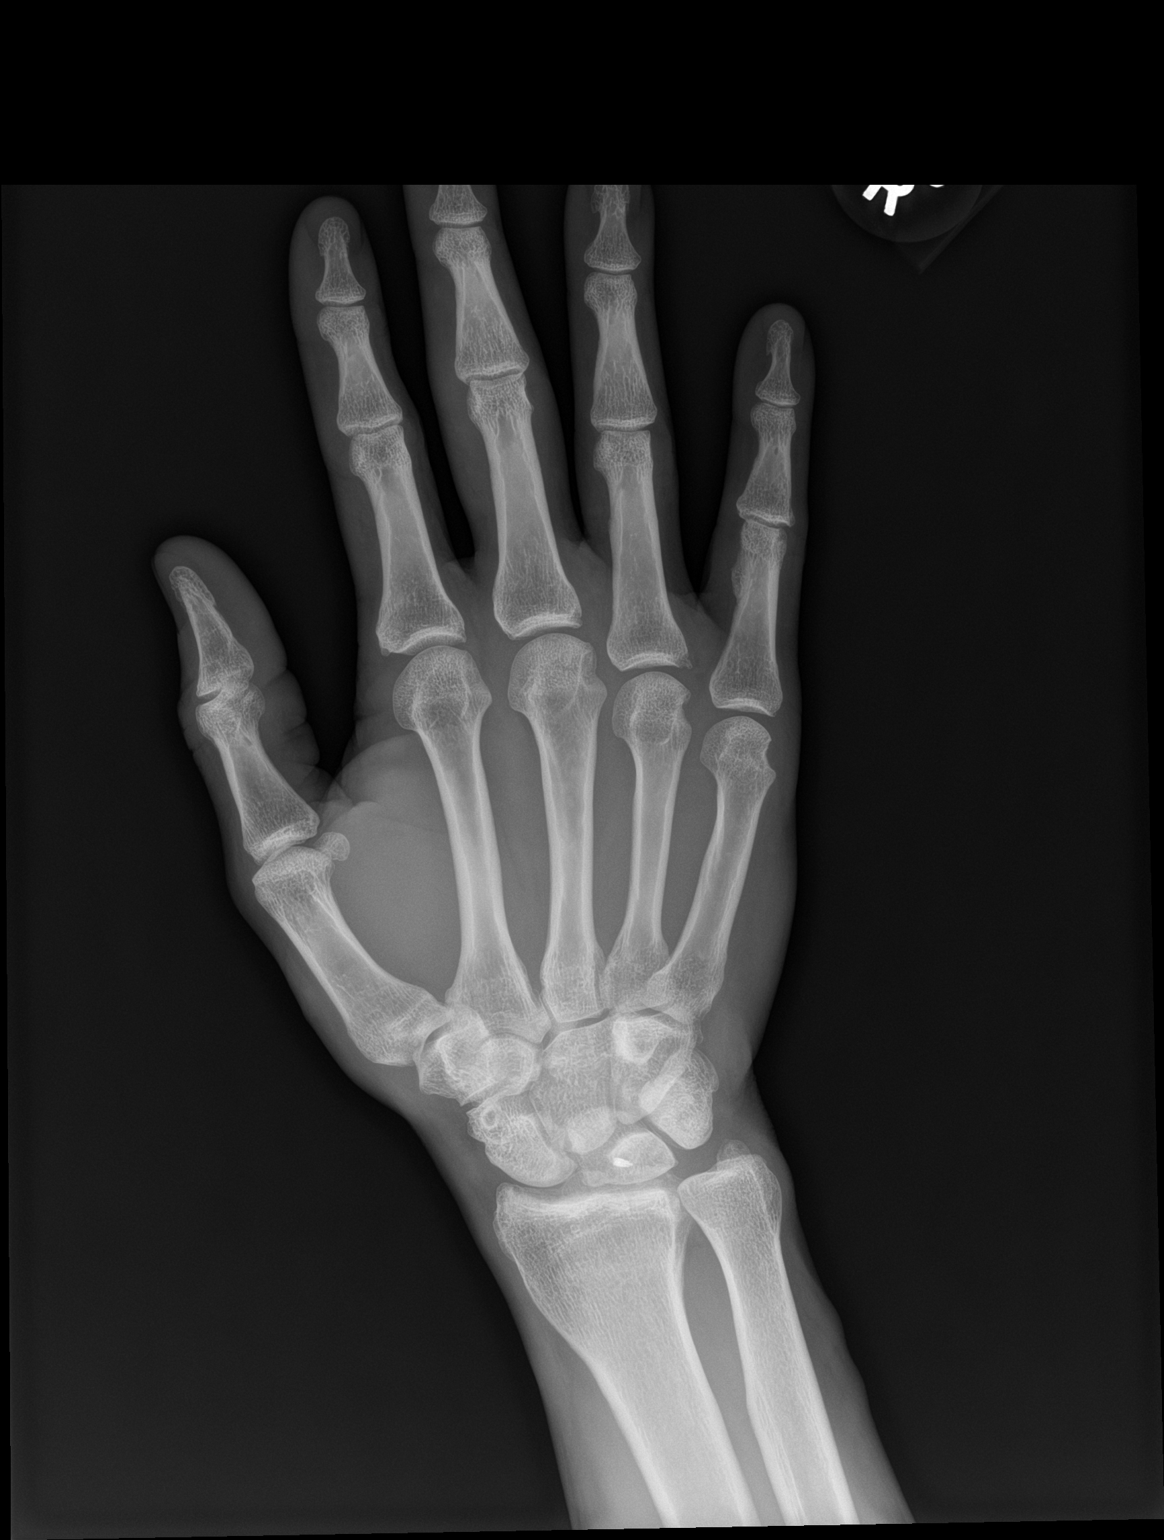

[hand obl]
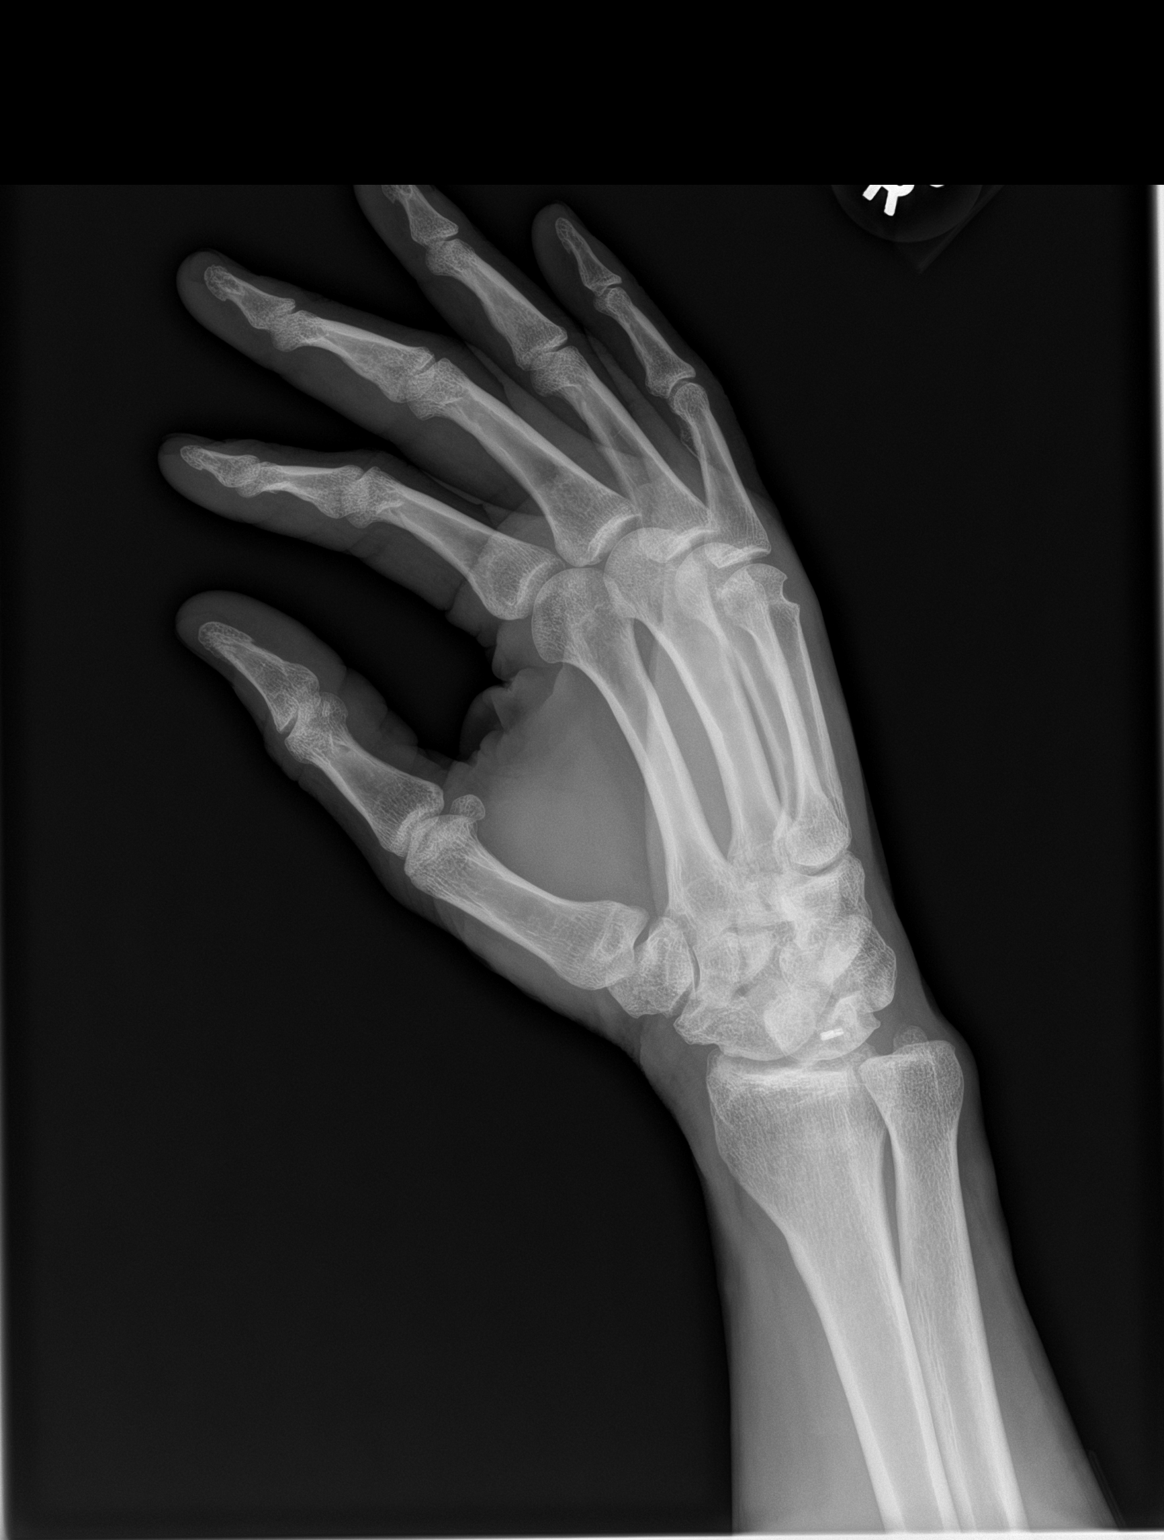

[hand lat]
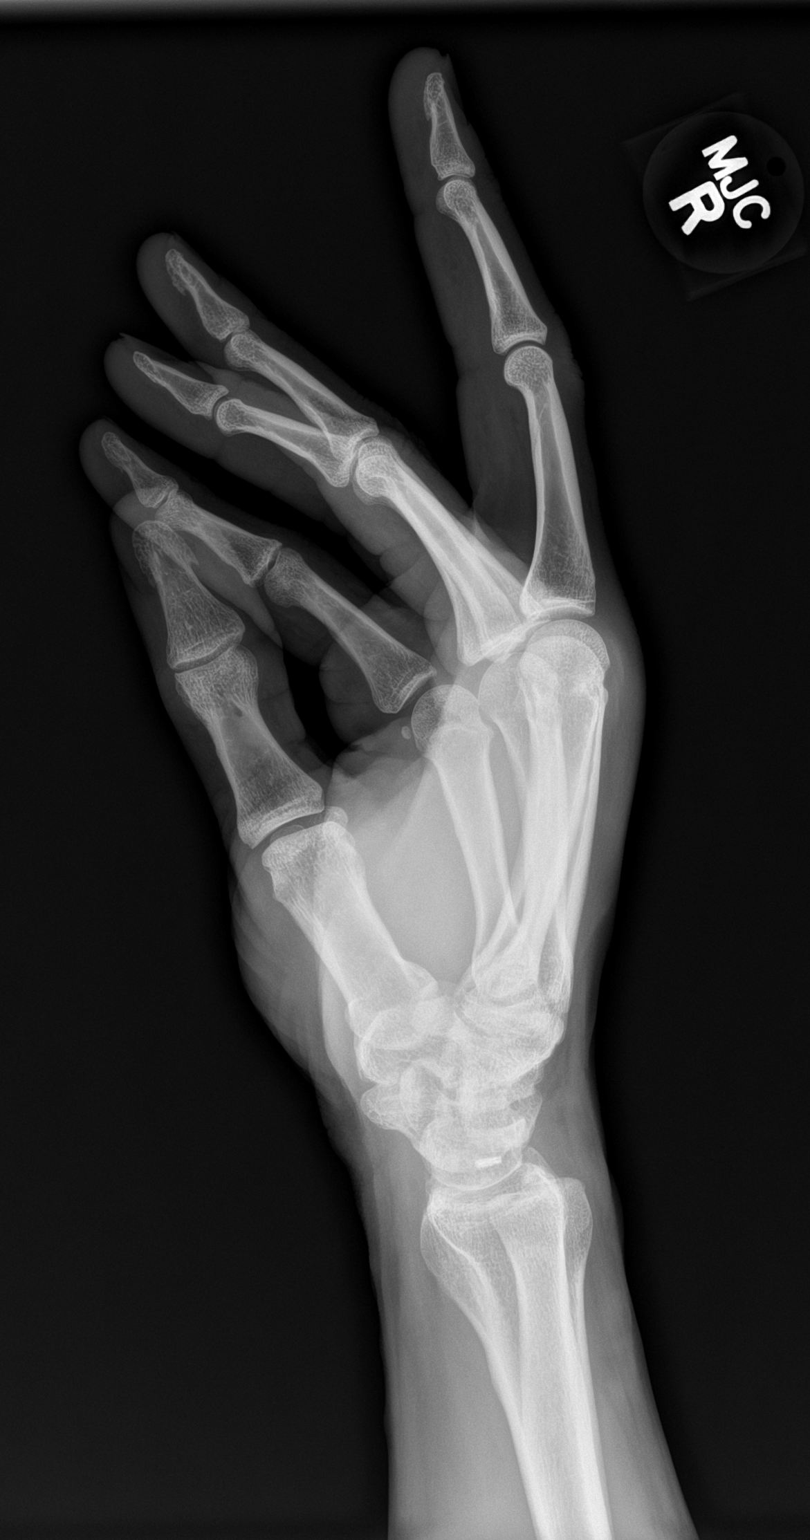

[3 of 3 positions shown; findings below may reference images not displayed]

FINDINGS: A small metallic density is noted within the lunate. Diffuse
degenerative change, most prominent about the radiocarpal joint.
Tiny corticated bony density noted the base of the proximal phalanx
of the right fourth digit. This most likely represents an old
fracture fragment. No acute bony or joint abnormality otherwise
identified. Soft tissues unremarkable.
IMPRESSION: 1.  Small metallic density noted within the lunate.

2. Diffuse degenerative change, most prominent about the radiocarpal
joint.

3. Tiny corticated bony density noted the base of the proximal
phalanx of the right fourth digit. This most likely represents an
old fracture fragment. No acute bony or joint abnormality otherwise
identified.

## 2022-05-16 ENCOUNTER — Ambulatory Visit: Payer: BC Managed Care – PPO | Admitting: Physician Assistant

## 2022-05-16 ENCOUNTER — Encounter: Payer: Self-pay | Admitting: Physician Assistant

## 2022-05-16 VITALS — BP 100/60 | HR 60 | Temp 97.7°F | Ht 78.0 in | Wt 199.0 lb

## 2022-05-16 DIAGNOSIS — Z7689 Persons encountering health services in other specified circumstances: Secondary | ICD-10-CM | POA: Diagnosis not present

## 2022-05-16 DIAGNOSIS — B354 Tinea corporis: Secondary | ICD-10-CM

## 2022-05-16 MED ORDER — TERBINAFINE HCL 250 MG PO TABS: 250.0000 mg | ORAL_TABLET | Freq: Every day | ORAL | 0 refills | Status: AC

## 2022-05-16 MED ORDER — CLOTRIMAZOLE 1 % EX CREA: 1.0000 "application " | TOPICAL_CREAM | Freq: Two times a day (BID) | CUTANEOUS | 0 refills | Status: DC

## 2022-05-16 NOTE — Progress Notes (Signed)
? ?New Patient Office Visit ? ?Subjective   ? ?Patient ID: Henry Stuart, male    DOB: [birth year]  Age: 41 y.o. MRN: [id number] ? ?CC:  ?Chief Complaint  ?Patient presents with  ? New Patient (Initial Visit)  ? ? ?HPI ?Henry Stuart presents to re-establish care. Patient denies new medical dx or surgeries since last visit in 2019. Patient does not take medications or supplements. States has seen dermatology in the past for skin lesions and completed an oral pill which helped. States was prescribed a cream but never got the cream. Patient has never been a smoker. Drinks 2 cups of caffeine daily. Exercise regimen includes running and weights 90 minutes daily  ? ? ?Outpatient Encounter Medications as of 05/16/2022  ?Medication Sig  ? clotrimazole (CLOTRIMAZOLE ANTI-FUNGAL) 1 % cream Apply 1 application. topically 2 (two) times daily.  ? terbinafine (LAMISIL) 250 MG tablet Take 1 tablet (250 mg total) by mouth daily for 14 days.  ? ?No facility-administered encounter medications on file as of 05/16/2022.  ? ? ?History reviewed. No pertinent past medical history. ? ?Past Surgical History:  ?Procedure Laterality Date  ? KNEE ARTHROPLASTY Left   ? WRIST SURGERY Right   ? ? ?Family History  ?Problem Relation Age of Onset  ? Cancer Mother   ?     GYN  ? Cancer Father   ?     esophageal  ? Healthy Sister   ? Healthy Brother   ? Healthy Daughter   ? Healthy Son   ? Healthy Son   ? Healthy Son   ? Healthy Sister   ? Healthy Sister   ? Healthy Sister   ? Healthy Sister   ? Healthy Sister   ? Healthy Brother   ? Healthy Brother   ? Healthy Brother   ? Healthy Brother   ? ? ?Social History  ? ?Socioeconomic History  ? Marital status: Married  ?  Spouse name: Not on file  ? Number of children: Not on file  ? Years of education: Not on file  ? Highest education level: Not on file  ?Occupational History  ? Not on file  ?Tobacco Use  ? Smoking status: Never  ? Smokeless tobacco: Never  ?Vaping Use  ? Vaping Use: Never used  ?Substance  and Sexual Activity  ? Alcohol use: Yes  ?  Alcohol/week: 5.0 standard drinks  ?  Types: 5 Cans of beer per week  ? Drug use: No  ? Sexual activity: Yes  ?  Birth control/protection: Pill  ?Other Topics Concern  ? Not on file  ?Social History Narrative  ? Not on file  ? ?Social Determinants of Health  ? ?Financial Resource Strain: Not on file  ?Food Insecurity: Not on file  ?Transportation Needs: Not on file  ?Physical Activity: Not on file  ?Stress: Not on file  ?Social Connections: Not on file  ?Intimate Partner Violence: Not on file  ? ? ?ROS ?Review of Systems:  ?A fourteen system review of systems was performed and found to be positive as per HPI. ?  ? ? ?Objective   ? ?BP 100/60   Pulse 60   Temp 97.7 ?F (36.5 ?C)   Ht 6\' 6"  (1.981 m)   Wt 199 lb (90.3 kg)   SpO2 98%   BMI 23.00 kg/m?  ? ?Physical Exam ?General:  Well Developed, well nourished, appropriate for stated age.  ?Neuro:  Alert and oriented,  extra-ocular muscles intact  ?HEENT:  Normocephalic,  atraumatic, neck supple ?Skin:  several gray lesions with central hypopigmentation scattered over his back ?Cardiac:  RRR, S1 S2 ?Respiratory:  ECTA B/L and A/P, Not using accessory muscles, speaking in full sentences- unlabored. ?Vascular:  Ext warm, no cyanosis apprec.; cap RF less 2 sec. ?Psych:  No HI/SI, judgement and insight good, Euthymic mood. Full Affect. ? ? ?  ? ?Assessment & Plan:  ? ?Problem List Items Addressed This Visit   ?None ?Visit Diagnoses   ? ? Tinea corporis    -  Primary  ? Relevant Medications  ? clotrimazole (CLOTRIMAZOLE ANTI-FUNGAL) 1 % cream  ? terbinafine (LAMISIL) 250 MG tablet  ? Encounter to establish care      ? ?  ? ?Encounter to establish care: ?-Discussed with patient has s/sx associated with tinea corporis. Will start oral antifungal with terbinafine 250 mg daily x 2 weeks. Advised can use topical clotrimazole 1% BID.  ?-Recommend to schedule CPE and FBW.  ? ?Return for CPE with FBW few days before in 4-6 weeks .   ? ?Mayer Masker, PA-C ? ? ?

## 2022-05-16 NOTE — Patient Instructions (Signed)

## 2022-06-04 ENCOUNTER — Other Ambulatory Visit: Payer: BC Managed Care – PPO

## 2022-06-04 DIAGNOSIS — Z0000 Encounter for general adult medical examination without abnormal findings: Secondary | ICD-10-CM

## 2022-06-04 DIAGNOSIS — Z130 Encounter for screening for diseases of the blood and blood-forming organs and certain disorders involving the immune mechanism: Secondary | ICD-10-CM

## 2022-06-05 LAB — CBC WITH DIFFERENTIAL/PLATELET
Basophils Absolute: 0.1 10*3/uL (ref 0.0–0.2)
Basos: 1 %
EOS (ABSOLUTE): 0.1 10*3/uL (ref 0.0–0.4)
Eos: 2 %
Hematocrit: 38 % (ref 37.5–51.0)
Hemoglobin: 12.8 g/dL — ABNORMAL LOW (ref 13.0–17.7)
Immature Grans (Abs): 0 10*3/uL (ref 0.0–0.1)
Immature Granulocytes: 0 %
Lymphocytes Absolute: 1.9 10*3/uL (ref 0.7–3.1)
Lymphs: 35 %
MCH: 31.2 pg (ref 26.6–33.0)
MCHC: 33.7 g/dL (ref 31.5–35.7)
MCV: 93 fL (ref 79–97)
Monocytes Absolute: 0.4 10*3/uL (ref 0.1–0.9)
Monocytes: 7 %
Neutrophils Absolute: 3 10*3/uL (ref 1.4–7.0)
Neutrophils: 55 %
Platelets: 221 10*3/uL (ref 150–450)
RBC: 4.1 x10E6/uL — ABNORMAL LOW (ref 4.14–5.80)
RDW: 11.8 % (ref 11.6–15.4)
WBC: 5.4 10*3/uL (ref 3.4–10.8)

## 2022-06-05 LAB — COMPREHENSIVE METABOLIC PANEL
ALT: 15 IU/L (ref 0–44)
AST: 22 IU/L (ref 0–40)
Albumin/Globulin Ratio: 1.9 (ref 1.2–2.2)
Albumin: 4.5 g/dL (ref 4.0–5.0)
Alkaline Phosphatase: 48 IU/L (ref 44–121)
BUN/Creatinine Ratio: 7 — ABNORMAL LOW (ref 9–20)
BUN: 11 mg/dL (ref 6–20)
Bilirubin Total: 0.7 mg/dL (ref 0.0–1.2)
CO2: 28 mmol/L (ref 20–29)
Calcium: 9.2 mg/dL (ref 8.7–10.2)
Chloride: 101 mmol/L (ref 96–106)
Creatinine, Ser: 1.51 mg/dL — ABNORMAL HIGH (ref 0.76–1.27)
Globulin, Total: 2.4 g/dL (ref 1.5–4.5)
Glucose: 87 mg/dL (ref 70–99)
Potassium: 4.5 mmol/L (ref 3.5–5.2)
Sodium: 140 mmol/L (ref 134–144)
Total Protein: 6.9 g/dL (ref 6.0–8.5)
eGFR: 60 mL/min/{1.73_m2} (ref >59)

## 2022-06-05 LAB — LIPID PANEL
Chol/HDL Ratio: 2.2 ratio (ref 0.0–5.0)
Cholesterol, Total: 125 mg/dL (ref 100–199)
HDL: 58 mg/dL (ref >39)
LDL Chol Calc (NIH): 55 mg/dL (ref 0–99)
Triglycerides: 53 mg/dL (ref 0–149)
VLDL Cholesterol Cal: 12 mg/dL (ref 5–40)

## 2022-06-05 LAB — HEMOGLOBIN A1C
Est. average glucose Bld gHb Est-mCnc: 97 mg/dL
Hgb A1c MFr Bld: 5 % (ref 4.8–5.6)

## 2022-06-05 LAB — TSH: TSH: 3.2 u[IU]/mL (ref 0.450–4.500)

## 2022-06-12 ENCOUNTER — Encounter: Payer: BC Managed Care – PPO | Admitting: Physician Assistant

## 2022-06-19 ENCOUNTER — Ambulatory Visit (INDEPENDENT_AMBULATORY_CARE_PROVIDER_SITE_OTHER): Payer: BC Managed Care – PPO | Admitting: Physician Assistant

## 2022-06-19 ENCOUNTER — Encounter: Payer: Self-pay | Admitting: Physician Assistant

## 2022-06-19 VITALS — BP 109/66 | HR 78 | Ht 78.0 in | Wt 192.5 lb

## 2022-06-19 DIAGNOSIS — Z0000 Encounter for general adult medical examination without abnormal findings: Secondary | ICD-10-CM | POA: Diagnosis not present

## 2022-06-19 DIAGNOSIS — D649 Anemia, unspecified: Secondary | ICD-10-CM | POA: Diagnosis not present

## 2022-06-19 DIAGNOSIS — R7989 Other specified abnormal findings of blood chemistry: Secondary | ICD-10-CM

## 2022-06-19 NOTE — Progress Notes (Signed)
Complete physical exam   Patient: Henry Stuart   DOB: [birth year]   41 y.o. Male  MRN: [id number] Visit Date: 06/19/2022   Chief Complaint  Patient presents with   Annual Exam    Patient presents for annual exam. Discuss labs   Subjective    Henry Stuart is a 41 y.o. male who presents today for a complete physical exam.  He reports consuming a general diet.  Exercise routine includes full body workouts and cardio.  He generally feels fairly well. He does not have additional problems to discuss today.    History reviewed. No pertinent past medical history. Past Surgical History:  Procedure Laterality Date   KNEE ARTHROPLASTY Left    WRIST SURGERY Right    Social History   Socioeconomic History   Marital status: Married    Spouse name: Not on file   Number of children: Not on file   Years of education: Not on file   Highest education level: Not on file  Occupational History   Not on file  Tobacco Use   Smoking status: Never   Smokeless tobacco: Never  Vaping Use   Vaping Use: Never used  Substance and Sexual Activity   Alcohol use: Yes    Alcohol/week: 5.0 standard drinks of alcohol    Types: 5 Cans of beer per week   Drug use: No   Sexual activity: Yes    Birth control/protection: Pill  Other Topics Concern   Not on file  Social History Narrative   Not on file   Social Determinants of Health   Financial Resource Strain: Not on file  Food Insecurity: Not on file  Transportation Needs: Not on file  Physical Activity: Not on file  Stress: Not on file  Social Connections: Not on file  Intimate Partner Violence: Not on file     Medications: Outpatient Medications Prior to Visit  Medication Sig   clotrimazole (CLOTRIMAZOLE ANTI-FUNGAL) 1 % cream Apply 1 application. topically 2 (two) times daily.   No facility-administered medications prior to visit.    Review of Systems Review of Systems:  A fourteen system review of systems was performed and  found to be positive as per HPI.   Last CBC Lab Results  Component Value Date   WBC 5.4 06/04/2022   HGB 12.8 (L) 06/04/2022   HCT 38.0 06/04/2022   MCV 93 06/04/2022   MCH 31.2 06/04/2022   RDW 11.8 06/04/2022   PLT 221 48/54/6270   Last metabolic panel Lab Results  Component Value Date   GLUCOSE 87 06/04/2022   NA 140 06/04/2022   K 4.5 06/04/2022   CL 101 06/04/2022   CO2 28 06/04/2022   BUN 11 06/04/2022   CREATININE 1.51 (H) 06/04/2022   EGFR 60 06/04/2022   CALCIUM 9.2 06/04/2022   PROT 6.9 06/04/2022   ALBUMIN 4.5 06/04/2022   LABGLOB 2.4 06/04/2022   AGRATIO 1.9 06/04/2022   BILITOT 0.7 06/04/2022   ALKPHOS 48 06/04/2022   AST 22 06/04/2022   ALT 15 06/04/2022   Last lipids Lab Results  Component Value Date   CHOL 125 06/04/2022   HDL 58 06/04/2022   LDLCALC 55 06/04/2022   TRIG 53 06/04/2022   CHOLHDL 2.2 06/04/2022   Last hemoglobin A1c Lab Results  Component Value Date   HGBA1C 5.0 06/04/2022   Last thyroid functions Lab Results  Component Value Date   TSH 3.200 06/04/2022   Last vitamin D Lab Results  Component Value Date  VD25OH 11.6 (L) 01/15/2018   Last vitamin B12 and Folate No results found for: "VITAMINB12", "FOLATE"  Objective     BP 109/66   Pulse 78   Ht 6' 6"  (1.981 m)   Wt 192 lb 8 oz (87.3 kg)   SpO2 99%   BMI 22.25 kg/m  BP Readings from Last 3 Encounters:  06/19/22 109/66  05/16/22 100/60  09/25/21 100/62   Wt Readings from Last 3 Encounters:  06/19/22 192 lb 8 oz (87.3 kg)  05/16/22 199 lb (90.3 kg)  09/25/21 196 lb 9.6 oz (89.2 kg)    Physical Exam   General Appearance:    Well developed, well nourished male. Alert, cooperative, in no acute distress, appears stated age  Head:    Normocephalic, without obvious abnormality, atraumatic  Eyes:    PERRL, conjunctiva/corneas clear, EOM's intact, fundi    benign, both eyes       Ears:    Normal TM's and external ear canals, both ears  Nose:   Nares normal,  septum midline, mucosa normal, no drainage   or sinus tenderness  Throat:   Lips, mucosa, and tongue normal; teeth and gums normal  Neck:   Supple, symmetrical, trachea midline, no adenopathy;       thyroid:  No enlargement/tenderness/nodules; no carotid   bruit or JVD  Back:     Symmetric, no curvature, ROM normal, no CVA tenderness  Lungs:     Clear to auscultation bilaterally, respirations unlabored  Chest wall:    No tenderness or deformity  Heart:    Normal heart rate. Normal rhythm. No murmurs, rubs, or gallops.  S1 and S2 normal  Abdomen:     Soft, non-tender, bowel sounds active all four quadrants,    no masses, no organomegaly  Genitalia:    deferred  Rectal:    deferred  Extremities:   All extremities are intact. No cyanosis or edema  Pulses:   2+ and symmetric all extremities  Skin:   Skin color, texture, turgor normal, no rashes or lesions  Lymph nodes:   Cervical and supraclavicular nodes normal  Neurologic:   CNII-XII grossly intact.       Last depression screening scores    06/19/2022    9:48 AM 05/16/2022   10:15 AM 09/29/2018   10:06 AM  PHQ 2/9 Scores  PHQ - 2 Score 0 0 0  PHQ- 9 Score 0  0   Last fall risk screening    06/19/2022    9:48 AM  Fall Risk   Falls in the past year? 0  Number falls in past yr: 0  Injury with Fall? 0  Risk for fall due to : No Fall Risks;Other (Comment)  Follow up Education provided;Falls evaluation completed     No results found for any visits on 06/19/22.  Assessment & Plan    Routine Health Maintenance and Physical Exam  Exercise Activities and Dietary recommendations -Discussed heart healthy diet low in fat and carbohydrates.   Immunization History  Administered Date(s) Administered   Tdap 01/01/2012, 09/29/2018    Health Maintenance  Topic Date Due   Hepatitis C Screening  Never done   INFLUENZA VACCINE  07/31/2022   TETANUS/TDAP  09/29/2028   HIV Screening  Completed   HPV VACCINES  Aged Out     Discussed health benefits of physical activity, and encouraged him to engage in regular exercise appropriate for his age and condition.  Problem List Items Addressed This Visit  Other   Healthcare maintenance - Primary   Other Visit Diagnoses     Blood creatinine increased compared with prior measurement       Low hemoglobin          Discussed with patient most recent lab results which are essentially within normal limits with the exception of elevated creatinine and mildly low hemoglobin. Discussed with patient to continue adequate hydration and reduce salty foods. Discussed increasing iron intake. Pt denies family hx of kidney disease. Will repeat renal function (CMP) and CBC in 4 weeks. Discussed if creatinine fails to improve then recommend referral to nephrology. Pt verbalized understanding.     Return in about 1 year (around 06/20/2023) for CPE and FBW; lab visit in 4 weeks for CMP, CBC.       Lorrene Reid, PA-C  [facility] at [facility] [phone] (phone) [phone] (fax)  [place]

## 2022-06-19 NOTE — Patient Instructions (Signed)
Iron-Rich Diet  Iron is a mineral that helps your body produce hemoglobin. Hemoglobin is a protein in red blood cells that carries oxygen to your body's tissues. Eating too little iron may cause you to feel weak and tired, and it can increase your risk of infection. Iron is naturally found in many foods, and many foods have iron added to them (are iron-fortified). You may need to follow an iron-rich diet if you do not have enough iron in your body due to certain medical conditions. The amount of iron that you need each day depends on your age, your sex, and any medical conditions you have. Follow instructions from your health care provider or a dietitian about how much iron you should eat each day. What are tips for following this plan? Reading food labels Check food labels to see how many milligrams (mg) of iron are in each serving. Cooking Cook foods in pots and pans that are made from iron. Take these steps to make it easier for your body to absorb iron from certain foods: Soak beans overnight before cooking. Soak whole grains overnight and drain them before using. Ferment flours before baking, such as by using yeast in bread dough. Meal planning When you eat foods that contain iron, you should eat them with foods that are high in vitamin C. These include oranges, peppers, tomatoes, potatoes, and mangoes. Vitamin C helps your body absorb iron. Certain foods and drinks prevent your body from absorbing iron properly. Avoid eating these foods in the same meal as iron-rich foods or with iron supplements. These foods include: Coffee, black tea, and red wine. Milk, dairy products, and foods that are high in calcium. Beans and soybeans. Whole grains. General information Take iron supplements only as told by your health care provider. An overdose of iron can be life-threatening. If you were prescribed iron supplements, take them with orange juice or a vitamin C supplement. When you eat  iron-fortified foods or take an iron supplement, you should also eat foods that naturally contain iron, such as meat, poultry, and fish. Eating naturally iron-rich foods helps your body absorb the iron that is added to other foods or contained in a supplement. Iron from animal sources is better absorbed than iron from plant sources. What foods should I eat? Fruits Prunes. Raisins. Eat fruits high in vitamin C, such as oranges, grapefruits, and strawberries, with iron-rich foods. Vegetables Spinach (cooked). Green peas. Broccoli. Fermented vegetables. Eat vegetables high in vitamin C, such as leafy greens, potatoes, bell peppers, and tomatoes, with iron-rich foods. Grains Iron-fortified breakfast cereal. Iron-fortified whole-wheat bread. Enriched rice. Sprouted grains. Meats and other proteins Beef liver. Beef. [place]. Chicken. Oysters. Shrimp. [place]. Sardines. Chickpeas. Nuts. Tofu. Pumpkin seeds. Beverages Tomato juice. Fresh orange juice. Prune juice. Hibiscus tea. Iron-fortified instant breakfast shakes. Sweets and desserts Blackstrap molasses. Seasonings and condiments Tahini. Fermented soy sauce. Other foods Wheat germ. The items listed above may not be a complete list of recommended foods and beverages. Contact a dietitian for more information. What foods should I limit? These are foods that should be limited while eating iron-rich foods as they can reduce the absorption of iron in your body. Grains Whole grains. Bran cereal. Bran flour. Meats and other proteins Soybeans. Products made from soy protein. Black beans. Lentils. Mung beans. Split peas. Dairy Milk. Cream. Cheese. Yogurt. Cottage cheese. Beverages Coffee. Black tea. Red wine. Sweets and desserts Cocoa. Chocolate. Ice cream. Seasonings and condiments Basil. Oregano. Large amounts of parsley. The items listed  above may not be a complete list of foods and beverages you should limit. Contact a dietitian for more  information. Summary Iron is a mineral that helps your body produce hemoglobin. Hemoglobin is a protein in red blood cells that carries oxygen to your body's tissues. Iron is naturally found in many foods, and many foods have iron added to them (are iron-fortified). When you eat foods that contain iron, you should eat them with foods that are high in vitamin C. Vitamin C helps your body absorb iron. Certain foods and drinks prevent your body from absorbing iron properly, such as whole grains and dairy products. You should avoid eating these foods in the same meal as iron-rich foods or with iron supplements. This information is not intended to replace advice given to you by your health care provider. Make sure you discuss any questions you have with your health care provider. Document Revised: 11/28/2020 Document Reviewed: 11/28/2020 Elsevier Patient Education  2023 [employer].   Preventive Care 31-30 Years Old, Male Preventive care refers to lifestyle choices and visits with your health care provider that can promote health and wellness. Preventive care visits are also called wellness exams. What can I expect for my preventive care visit? Counseling During your preventive care visit, your health care provider may ask about your: Medical history, including: Past medical problems. Family medical history. Current health, including: Emotional well-being. Home life and relationship well-being. Sexual activity. Lifestyle, including: Alcohol, nicotine or tobacco, and drug use. Access to firearms. Diet, exercise, and sleep habits. Safety issues such as seatbelt and bike helmet use. Sunscreen use. Work and work Astronomer. Physical exam Your health care provider may check your: Height and weight. These may be used to calculate your BMI (body mass index). BMI is a measurement that tells if you are at a healthy weight. Waist circumference. This measures the distance around your waistline.  This measurement also tells if you are at a healthy weight and may help predict your risk of certain diseases, such as type 2 diabetes and high blood pressure. Heart rate and blood pressure. Body temperature.  Vaccines are usually given at various ages, according to a schedule. Your health care provider will recommend vaccines for you based on your age, medical history, and lifestyle or other factors, such as travel or where you work. What tests do I need? Screening Your health care provider may recommend screening tests for certain conditions. This may include: Lipid and cholesterol levels. Diabetes screening. This is done by checking your blood sugar (glucose) after you have not eaten for a while (fasting). Hepatitis B test. Hepatitis C test. HIV (human immunodeficiency virus) test. STI (sexually transmitted infection) testing, if you are at risk. Talk with your health care provider about your test results, treatment options, and if necessary, the need for more tests. Follow these instructions at home: Eating and drinking  Eat a healthy diet that includes fresh fruits and vegetables, whole grains, lean protein, and low-fat dairy products. Drink enough fluid to keep your urine pale yellow. Take vitamin and mineral supplements as recommended by your health care provider. Do not drink alcohol if your health care provider tells you not to drink. If you drink alcohol: Limit how much you have to 0-2 drinks a day. Know how much alcohol is in your drink. In the [place] one drink equals one 12 oz bottle of beer (355 mL), one 5 oz glass of wine (148 mL), or one 1 oz glass of hard liquor (44 mL).  Lifestyle Brush your teeth every morning and night with fluoride toothpaste. Floss one time each day. Exercise for at least 30 minutes 5 or more days each week. Do not use any products that contain nicotine or tobacco. These products include cigarettes, chewing tobacco, and vaping devices, such as  e-cigarettes. If you need help quitting, ask your health care provider. Do not use drugs. If you are sexually active, practice safe sex. Use a condom or other form of protection to prevent STIs. Find healthy ways to manage stress, such as: Meditation, yoga, or listening to music. Journaling. Talking to a trusted person. Spending time with friends and family. Minimize exposure to UV radiation to reduce your risk of skin cancer. Safety Always wear your seat belt while driving or riding in a vehicle. Do not drive: If you have been drinking alcohol. Do not ride with someone who has been drinking. If you have been using any mind-altering substances or drugs. While texting. When you are tired or distracted. Wear a helmet and other protective equipment during sports activities. If you have firearms in your house, make sure you follow all gun safety procedures. Seek help if you have been physically or sexually abused. What's next? Go to your health care provider once a year for an annual wellness visit. Ask your health care provider how often you should have your eyes and teeth checked. Stay up to date on all vaccines. This information is not intended to replace advice given to you by your health care provider. Make sure you discuss any questions you have with your health care provider. Document Revised: 06/14/2021 Document Reviewed: 06/14/2021 Elsevier Patient Education  2023 [employer].

## 2022-07-18 ENCOUNTER — Other Ambulatory Visit: Payer: BC Managed Care – PPO

## 2023-04-19 ENCOUNTER — Other Ambulatory Visit: Payer: Self-pay

## 2023-04-19 ENCOUNTER — Emergency Department (HOSPITAL_COMMUNITY): Payer: BC Managed Care – PPO | Admitting: Anesthesiology

## 2023-04-19 ENCOUNTER — Encounter (HOSPITAL_COMMUNITY): Payer: Self-pay

## 2023-04-19 ENCOUNTER — Emergency Department (HOSPITAL_COMMUNITY): Payer: BC Managed Care – PPO

## 2023-04-19 ENCOUNTER — Encounter (HOSPITAL_COMMUNITY): Admission: EM | Disposition: A | Discharge status: Home / Self Care | Payer: Self-pay | Source: Home / Self Care

## 2023-04-19 ENCOUNTER — Inpatient Hospital Stay (HOSPITAL_COMMUNITY)
Admission: EM | Admit: 2023-04-19 | Discharge: 2023-04-26 | DRG: 398 | Disposition: A | Discharge status: Home / Self Care | Payer: BC Managed Care – PPO | Attending: General Surgery | Admitting: General Surgery

## 2023-04-19 DIAGNOSIS — K3580 Unspecified acute appendicitis: Secondary | ICD-10-CM | POA: Diagnosis present

## 2023-04-19 DIAGNOSIS — K35891 Other acute appendicitis without perforation, with gangrene: Secondary | ICD-10-CM | POA: Diagnosis not present

## 2023-04-19 DIAGNOSIS — K567 Ileus, unspecified: Secondary | ICD-10-CM | POA: Diagnosis not present

## 2023-04-19 DIAGNOSIS — Z96652 Presence of left artificial knee joint: Secondary | ICD-10-CM | POA: Diagnosis present

## 2023-04-19 DIAGNOSIS — K352 Acute appendicitis with generalized peritonitis, without perforation or abscess: Principal | ICD-10-CM

## 2023-04-19 DIAGNOSIS — K9189 Other postprocedural complications and disorders of digestive system: Secondary | ICD-10-CM | POA: Diagnosis not present

## 2023-04-19 DIAGNOSIS — N179 Acute kidney failure, unspecified: Secondary | ICD-10-CM | POA: Diagnosis not present

## 2023-04-19 HISTORY — DX: Unspecified acute appendicitis: K35.80

## 2023-04-19 HISTORY — PX: LAPAROSCOPIC APPENDECTOMY: SHX408

## 2023-04-19 LAB — I-STAT CHEM 8, ED
BUN: 14 mg/dL (ref 6–20)
Calcium, Ion: 1.18 mmol/L (ref 1.15–1.40)
Chloride: 97 mmol/L — ABNORMAL LOW (ref 98–111)
Creatinine, Ser: 1.5 mg/dL — ABNORMAL HIGH (ref 0.61–1.24)
Glucose, Bld: 110 mg/dL — ABNORMAL HIGH (ref 70–99)
HCT: 44 % (ref 39.0–52.0)
Hemoglobin: 15 g/dL (ref 13.0–17.0)
Potassium: 3.2 mmol/L — ABNORMAL LOW (ref 3.5–5.1)
Sodium: 136 mmol/L (ref 135–145)
TCO2: 28 mmol/L (ref 22–32)

## 2023-04-19 LAB — CBC WITH DIFFERENTIAL/PLATELET
Abs Immature Granulocytes: 0.01 10*3/uL (ref 0.00–0.07)
Basophils Absolute: 0 10*3/uL (ref 0.0–0.1)
Basophils Relative: 0 %
Eosinophils Absolute: 0 10*3/uL (ref 0.0–0.5)
Eosinophils Relative: 0 %
HCT: 40.8 % (ref 39.0–52.0)
Hemoglobin: 14.1 g/dL (ref 13.0–17.0)
Immature Granulocytes: 0 %
Lymphocytes Relative: 20 %
Lymphs Abs: 2 10*3/uL (ref 0.7–4.0)
MCH: 31.6 pg (ref 26.0–34.0)
MCHC: 34.6 g/dL (ref 30.0–36.0)
MCV: 91.5 fL (ref 80.0–100.0)
Monocytes Absolute: 0.5 10*3/uL (ref 0.1–1.0)
Monocytes Relative: 5 %
Neutro Abs: 7.5 10*3/uL (ref 1.7–7.7)
Neutrophils Relative %: 75 %
Platelets: 195 10*3/uL (ref 150–400)
RBC: 4.46 MIL/uL (ref 4.22–5.81)
RDW: 11.8 % (ref 11.5–15.5)
WBC: 10 10*3/uL (ref 4.0–10.5)
nRBC: 0 % (ref 0.0–0.2)

## 2023-04-19 LAB — COMPREHENSIVE METABOLIC PANEL
ALT: 17 U/L (ref 0–44)
AST: 22 U/L (ref 15–41)
Albumin: 4.1 g/dL (ref 3.5–5.0)
Alkaline Phosphatase: 39 U/L (ref 38–126)
Anion gap: 9 (ref 5–15)
BUN: 12 mg/dL (ref 6–20)
CO2: 25 mmol/L (ref 22–32)
Calcium: 8.9 mg/dL (ref 8.9–10.3)
Chloride: 98 mmol/L (ref 98–111)
Creatinine, Ser: 1.5 mg/dL — ABNORMAL HIGH (ref 0.61–1.24)
GFR, Estimated: 60 mL/min — ABNORMAL LOW (ref >60)
Glucose, Bld: 117 mg/dL — ABNORMAL HIGH (ref 70–99)
Potassium: 3.1 mmol/L — ABNORMAL LOW (ref 3.5–5.1)
Sodium: 132 mmol/L — ABNORMAL LOW (ref 135–145)
Total Bilirubin: 1 mg/dL (ref 0.3–1.2)
Total Protein: 7.1 g/dL (ref 6.5–8.1)

## 2023-04-19 LAB — LIPASE, BLOOD: Lipase: 25 U/L (ref 11–51)

## 2023-04-19 SURGERY — APPENDECTOMY, LAPAROSCOPIC
Anesthesia: General | Site: Abdomen

## 2023-04-19 MED ORDER — FENTANYL CITRATE (PF) 250 MCG/5ML IJ SOLN
INTRAMUSCULAR | Status: AC
  Filled 2023-04-19: qty 5

## 2023-04-19 MED ORDER — ONDANSETRON HCL 4 MG/2ML IJ SOLN
INTRAMUSCULAR | Status: DC | PRN
  Administered 2023-04-19: 4 mg via INTRAVENOUS

## 2023-04-19 MED ORDER — METHOCARBAMOL 750 MG PO TABS: 750.0000 mg | ORAL_TABLET | Freq: Four times a day (QID) | ORAL | 1 refills | Status: DC

## 2023-04-19 MED ORDER — SUCCINYLCHOLINE CHLORIDE 200 MG/10ML IV SOSY
PREFILLED_SYRINGE | INTRAVENOUS | Status: DC | PRN
  Administered 2023-04-19: 120 mg via INTRAVENOUS

## 2023-04-19 MED ORDER — PROPOFOL 10 MG/ML IV BOLUS
INTRAVENOUS | Status: AC
  Filled 2023-04-19: qty 20

## 2023-04-19 MED ORDER — METRONIDAZOLE 500 MG/100ML IV SOLN
500.0000 mg | Freq: Once | INTRAVENOUS | Status: AC
  Administered 2023-04-19: 500 mg via INTRAVENOUS
  Filled 2023-04-19: qty 100

## 2023-04-19 MED ORDER — IOHEXOL 350 MG/ML SOLN
75.0000 mL | Freq: Once | INTRAVENOUS | Status: AC | PRN
  Administered 2023-04-19: 75 mL via INTRAVENOUS

## 2023-04-19 MED ORDER — IBUPROFEN 600 MG PO TABS: 600.0000 mg | ORAL_TABLET | Freq: Four times a day (QID) | ORAL | 1 refills | Status: DC | PRN

## 2023-04-19 MED ORDER — HYDROMORPHONE HCL 1 MG/ML IJ SOLN
1.0000 mg | Freq: Once | INTRAMUSCULAR | Status: AC
  Administered 2023-04-19: 1 mg via INTRAVENOUS
  Filled 2023-04-19: qty 1

## 2023-04-19 MED ORDER — DEXAMETHASONE SODIUM PHOSPHATE 10 MG/ML IJ SOLN
INTRAMUSCULAR | Status: AC
  Filled 2023-04-19: qty 1

## 2023-04-19 MED ORDER — DEXAMETHASONE SODIUM PHOSPHATE 10 MG/ML IJ SOLN
INTRAMUSCULAR | Status: DC | PRN
  Administered 2023-04-19: 10 mg via INTRAVENOUS

## 2023-04-19 MED ORDER — MORPHINE SULFATE (PF) 2 MG/ML IV SOLN
4.0000 mg | Freq: Once | INTRAVENOUS | Status: AC
  Administered 2023-04-19: 4 mg via INTRAVENOUS
  Filled 2023-04-19: qty 2

## 2023-04-19 MED ORDER — SUGAMMADEX SODIUM 200 MG/2ML IV SOLN
INTRAVENOUS | Status: DC | PRN
  Administered 2023-04-19: 200 mg via INTRAVENOUS

## 2023-04-19 MED ORDER — LIDOCAINE 2% (20 MG/ML) 5 ML SYRINGE
INTRAMUSCULAR | Status: AC
  Filled 2023-04-19: qty 5

## 2023-04-19 MED ORDER — LIDOCAINE 2% (20 MG/ML) 5 ML SYRINGE
INTRAMUSCULAR | Status: DC | PRN
  Administered 2023-04-19: 20 mg via INTRAVENOUS

## 2023-04-19 MED ORDER — MIDAZOLAM HCL 2 MG/2ML IJ SOLN
INTRAMUSCULAR | Status: DC | PRN
  Administered 2023-04-19: 2 mg via INTRAVENOUS

## 2023-04-19 MED ORDER — ONDANSETRON HCL 4 MG/2ML IJ SOLN
INTRAMUSCULAR | Status: AC
  Filled 2023-04-19: qty 2

## 2023-04-19 MED ORDER — STERILE WATER FOR IRRIGATION IR SOLN
Status: DC | PRN
  Administered 2023-04-19: 200 mL

## 2023-04-19 MED ORDER — MIDAZOLAM HCL 2 MG/2ML IJ SOLN
INTRAMUSCULAR | Status: AC
  Filled 2023-04-19: qty 2

## 2023-04-19 MED ORDER — ACETAMINOPHEN 500 MG PO TABS: 1000.0000 mg | ORAL_TABLET | Freq: Four times a day (QID) | ORAL | 3 refills | Status: DC

## 2023-04-19 MED ORDER — HYDROMORPHONE HCL 1 MG/ML IJ SOLN
1.0000 mg | Freq: Once | INTRAMUSCULAR | Status: DC
  Filled 2023-04-19: qty 1

## 2023-04-19 MED ORDER — ONDANSETRON HCL 4 MG/2ML IJ SOLN
4.0000 mg | Freq: Once | INTRAMUSCULAR | Status: AC
  Administered 2023-04-19: 4 mg via INTRAVENOUS
  Filled 2023-04-19: qty 2

## 2023-04-19 MED ORDER — FENTANYL CITRATE (PF) 100 MCG/2ML IJ SOLN: 25.0000 ug | INTRAMUSCULAR | Status: DC | PRN

## 2023-04-19 MED ORDER — SODIUM CHLORIDE 0.9 % IV SOLN
2.0000 g | Freq: Once | INTRAVENOUS | Status: AC
  Administered 2023-04-19: 2 g via INTRAVENOUS
  Filled 2023-04-19: qty 20

## 2023-04-19 MED ORDER — BUPIVACAINE LIPOSOME 1.3 % IJ SUSP
INTRAMUSCULAR | Status: DC | PRN
  Administered 2023-04-19: 20 mL

## 2023-04-19 MED ORDER — ROCURONIUM BROMIDE 10 MG/ML (PF) SYRINGE
PREFILLED_SYRINGE | INTRAVENOUS | Status: AC
  Filled 2023-04-19: qty 10

## 2023-04-19 MED ORDER — 0.9 % SODIUM CHLORIDE (POUR BTL) OPTIME
TOPICAL | Status: DC | PRN
  Administered 2023-04-19: 1000 mL

## 2023-04-19 MED ORDER — DOCUSATE SODIUM 100 MG PO CAPS: 100.0000 mg | ORAL_CAPSULE | Freq: Two times a day (BID) | ORAL | 2 refills | Status: DC

## 2023-04-19 MED ORDER — LACTATED RINGERS IV SOLN: INTRAVENOUS | Status: DC | PRN

## 2023-04-19 MED ORDER — BUPIVACAINE HCL (PF) 0.25 % IJ SOLN
INTRAMUSCULAR | Status: DC | PRN
  Administered 2023-04-19: 20 mL

## 2023-04-19 MED ORDER — ACETAMINOPHEN 10 MG/ML IV SOLN
INTRAVENOUS | Status: AC
  Filled 2023-04-19: qty 100

## 2023-04-19 MED ORDER — BUPIVACAINE HCL (PF) 0.25 % IJ SOLN
INTRAMUSCULAR | Status: AC
  Filled 2023-04-19: qty 30

## 2023-04-19 MED ORDER — AMISULPRIDE (ANTIEMETIC) 5 MG/2ML IV SOLN: 10.0000 mg | Freq: Once | INTRAVENOUS | Status: DC | PRN

## 2023-04-19 MED ORDER — PROPOFOL 10 MG/ML IV BOLUS
INTRAVENOUS | Status: DC | PRN
  Administered 2023-04-19: 160 mg via INTRAVENOUS

## 2023-04-19 MED ORDER — SODIUM CHLORIDE 0.9 % IV BOLUS: 1000.0000 mL | Freq: Once | INTRAVENOUS | Status: DC

## 2023-04-19 MED ORDER — SODIUM CHLORIDE 0.9 % IR SOLN
Status: DC | PRN
  Administered 2023-04-19: 1000 mL

## 2023-04-19 MED ORDER — ROCURONIUM BROMIDE 10 MG/ML (PF) SYRINGE
PREFILLED_SYRINGE | INTRAVENOUS | Status: DC | PRN
  Administered 2023-04-19: 40 mg via INTRAVENOUS

## 2023-04-19 MED ORDER — FENTANYL CITRATE (PF) 250 MCG/5ML IJ SOLN
INTRAMUSCULAR | Status: DC | PRN
  Administered 2023-04-19 (×2): 50 ug via INTRAVENOUS

## 2023-04-19 MED ORDER — KCL-LACTATED RINGERS-D5W 20 MEQ/L IV SOLN
INTRAVENOUS | Status: AC
  Filled 2023-04-19: qty 1000

## 2023-04-19 MED ORDER — SUCCINYLCHOLINE CHLORIDE 200 MG/10ML IV SOSY
PREFILLED_SYRINGE | INTRAVENOUS | Status: AC
  Filled 2023-04-19: qty 10

## 2023-04-19 MED ORDER — ACETAMINOPHEN 10 MG/ML IV SOLN
INTRAVENOUS | Status: DC | PRN
  Administered 2023-04-19: 1000 mg via INTRAVENOUS

## 2023-04-19 MED ORDER — OXYCODONE HCL 5 MG PO TABS: 5.0000 mg | ORAL_TABLET | ORAL | 0 refills | Status: DC | PRN

## 2023-04-19 MED ORDER — BUPIVACAINE LIPOSOME 1.3 % IJ SUSP
INTRAMUSCULAR | Status: AC
  Filled 2023-04-19: qty 20

## 2023-04-19 SURGICAL SUPPLY — 50 items
APPLIER CLIP ROT 10 11.4 M/L (STAPLE)
BAG COUNTER SPONGE SURGICOUNT (BAG) ×1 IMPLANT
BLADE CLIPPER SURG (BLADE) IMPLANT
CANISTER SUCT 3000ML PPV (MISCELLANEOUS) IMPLANT
CHLORAPREP W/TINT 26 (MISCELLANEOUS) ×1 IMPLANT
CLIP APPLIE ROT 10 11.4 M/L (STAPLE) IMPLANT
COVER SURGICAL LIGHT HANDLE (MISCELLANEOUS) ×1 IMPLANT
CUTTER FLEX LINEAR 45M (STAPLE) ×1 IMPLANT
DERMABOND ADVANCED .7 DNX12 (GAUZE/BANDAGES/DRESSINGS) IMPLANT
DERMABOND ADVANCED .7 DNX6 (GAUZE/BANDAGES/DRESSINGS) ×1 IMPLANT
ELECT CAUTERY BLADE 6.4 (BLADE) ×1 IMPLANT
ELECT REM PT RETURN 9FT ADLT (ELECTROSURGICAL) ×1
ELECTRODE REM PT RTRN 9FT ADLT (ELECTROSURGICAL) ×1 IMPLANT
ENDOLOOP SUT PDS II  0 18 (SUTURE)
ENDOLOOP SUT PDS II 0 18 (SUTURE) IMPLANT
GLOVE BIO SURGEON STRL SZ 6.5 (GLOVE) ×1 IMPLANT
GLOVE BIOGEL PI IND STRL 6 (GLOVE) ×1 IMPLANT
GOWN STRL REUS W/ TWL LRG LVL3 (GOWN DISPOSABLE) ×3 IMPLANT
GOWN STRL REUS W/TWL LRG LVL3 (GOWN DISPOSABLE) ×2
IRRIG SUCT STRYKERFLOW 2 WTIP (MISCELLANEOUS) ×1
IRRIGATION SUCT STRKRFLW 2 WTP (MISCELLANEOUS) IMPLANT
KIT BASIN OR (CUSTOM PROCEDURE TRAY) ×1 IMPLANT
KIT TURNOVER KIT B (KITS) ×1 IMPLANT
NDL INSUFFLATION 14GA 120MM (NEEDLE) IMPLANT
NEEDLE INSUFFLATION 14GA 120MM (NEEDLE) IMPLANT
NS IRRIG 1000ML POUR BTL (IV SOLUTION) ×1 IMPLANT
PAD ARMBOARD 7.5X6 YLW CONV (MISCELLANEOUS) ×2 IMPLANT
PENCIL BUTTON HOLSTER BLD 10FT (ELECTRODE) ×1 IMPLANT
RELOAD 45 VASCULAR/THIN (ENDOMECHANICALS) ×1 IMPLANT
RELOAD STAPLE 45 2.5 WHT GRN (ENDOMECHANICALS) ×1 IMPLANT
RELOAD STAPLE 45 3.5 BLU ETS (ENDOMECHANICALS) ×1 IMPLANT
RELOAD STAPLE TA45 3.5 REG BLU (ENDOMECHANICALS) ×1 IMPLANT
SCISSORS LAP 5X35 DISP (ENDOMECHANICALS) IMPLANT
SET TUBE SMOKE EVAC HIGH FLOW (TUBING) ×1 IMPLANT
SHEARS HARMONIC ACE PLUS 36CM (ENDOMECHANICALS) IMPLANT
SLEEVE Z-THREAD 5X100MM (TROCAR) ×1 IMPLANT
SPECIMEN JAR SMALL (MISCELLANEOUS) ×1 IMPLANT
SUT MNCRL AB 4-0 PS2 18 (SUTURE) ×1 IMPLANT
SUT VICRYL 0 UR6 27IN ABS (SUTURE) IMPLANT
SYS BAG RETRIEVAL 10MM (BASKET) ×1
SYSTEM BAG RETRIEVAL 10MM (BASKET) ×1 IMPLANT
TOWEL GREEN STERILE (TOWEL DISPOSABLE) ×1 IMPLANT
TOWEL GREEN STERILE FF (TOWEL DISPOSABLE) ×1 IMPLANT
TRAY FOL W/BAG SLVR 16FR STRL (SET/KITS/TRAYS/PACK) IMPLANT
TRAY FOLEY W/BAG SLVR 16FR LF (SET/KITS/TRAYS/PACK) ×1
TRAY LAPAROSCOPIC MC (CUSTOM PROCEDURE TRAY) ×1 IMPLANT
TROCAR BALLN 12MMX100 BLUNT (TROCAR) IMPLANT
TROCAR Z-THREAD OPTICAL 5X100M (TROCAR) ×1 IMPLANT
WARMER LAPAROSCOPE (MISCELLANEOUS) ×1 IMPLANT
WATER STERILE IRR 1000ML POUR (IV SOLUTION) ×1 IMPLANT

## 2023-04-19 NOTE — Discharge Instructions (Signed)
[facility]  LAPAROSCOPIC SURGERY: POST OP INSTRUCTIONS Always review your discharge instruction sheet given to you by the facility where your surgery was performed. IF YOU HAVE DISABILITY OR FAMILY LEAVE FORMS, YOU MUST BRING THEM TO THE OFFICE FOR PROCESSING.   DO NOT GIVE THEM TO YOUR DOCTOR.  PAIN CONTROL  Pain regimen: take over-the-counter tylenol (acetaminophen)  every six hours, the prescription ibuprofen ( ) every six hours and the robaxin (methocarbamol)  every six hours. With all three of these, you should be taking something every two hours. Example: tylenol ( acetaminophen) at 8am, ibuprofen at 10am, robaxin (methocarbamol) at 12pm, tylenol (acetaminophen) again at 2pm, ibuprofen again at 4pm, robaxin (methocarbamol) at 6pm. You also have a prescription for oxycodone, which should be taken if the tylenol (acetaminophen), ibuprofen, and robaxin (methocarbamol) are not enough to control your pain. You may take the oxycodone as frequently as every four hours as needed, but if you are taking the other medications as above, you should not need the oxycodone this frequently. You have also been given a prescription for colace (docusate) which is a stool softener. Please take this as prescribed because the oxycodone can cause constipation and the colace (docusate) will minimize or prevent constipation. Do not drive while taking or under the influence of the oxycodone as it is a narcotic medication. Use ice packs to help control pain. If you need a refill on your pain medication, please contact your pharmacy.  They will contact our office to request authorization. Prescriptions will not be filled after 5pm or on week-ends.  HOME MEDICATIONS Take your usually prescribed medications unless otherwise directed.  DIET You should follow a light diet the first few days after arrival home.  Be sure to include lots of fluids daily.   CONSTIPATION It is common to  experience some constipation after surgery and if you are taking pain medication.  Increasing fluid intake and taking a stool softener (such as Colace) will usually help or prevent this problem from occurring.  A mild laxative (Milk of Magnesia or Miralax) should be taken according to package instructions if there are no bowel movements after 48 hours.  WOUND/INCISION CARE Most patients will experience some swelling and bruising in the area of the incisions.  Ice packs will help.  Swelling and bruising can take several days to resolve.  May shower beginning 04/20/2023.  Do not peel off or scrub skin glue. May allow warm soapy water to run over incision, then rinse and pat dry.  Do not soak in any water (tubs, hot tubs, pools, lakes, oceans) for one week.   ACTIVITIES You may resume regular (light) daily activities beginning the next day--such as daily self-care, walking, climbing stairs--gradually increasing activities as tolerated.  You may have sexual intercourse when it is comfortable.   No lifting greater than 5 pounds for six weeks.  You may drive when you are no longer taking narcotic pain medication, you can comfortably wear a seatbelt, and you can safely maneuver your car and apply brakes.  FOLLOW-UP You should see your doctor in the office for a follow-up appointment approximately 2-3 weeks after your surgery.  You should have been given your post-op/follow-up appointment when your surgery was scheduled.  If you did not receive a post-op/follow-up appointment, make sure that you call for this appointment within a day or two after you arrive home to insure a convenient appointment time.  WHEN TO CALL YOUR DOCTOR: Fever over 101.5 Inability to urinate  Continued bleeding from incision. Increased pain, redness, or drainage from the incision. Increasing abdominal pain  The clinic staff is available to answer your questions during regular business hours.  Please don't hesitate to call and  ask to speak to one of the nurses for clinical concerns.  If you have a medical emergency, go to the nearest emergency room or call 911.  A surgeon from [facility] is always on call at the hospital. [address] [place], [place], [place]  [address] ? [address], [place], [place]   [address] [phone] ? [phone] ? FAX [phone] Web site: [email or web address]

## 2023-04-19 NOTE — ED Provider Triage Note (Signed)
Emergency Medicine Provider Triage Evaluation Note  Henry Stuart , a 42 y.o. male  was evaluated in triage.  Pt complains of RLQ abdominal pain, nausea, vomiting, and feeling like he will pass out.  He states the pain has become so severe, he feels like he cannot walk.  He feels that he may be constipated.  Denies fever, diarrhea.  Review of Systems  Positive: As above Negative: As above  Physical Exam  BP 118/65 (BP Location: Right Arm)   Pulse 73   Temp 98 F (36.7 C)   Resp (!) 22   SpO2 100%  Gen:   Awake, no distress   Resp:  Normal effort  MSK:   Moves extremities without difficulty  Other:  RLQ tender to palpation, no abdominal distension  Medical Decision Making  Medically screening exam initiated at 2:13 PM.  Appropriate orders placed.  Henry Stuart was informed that the remainder of the evaluation will be completed by another provider, this initial triage assessment does not replace that evaluation, and the importance of remaining in the ED until their evaluation is complete.     Melton Alar R, PA-C 04/19/23 1414

## 2023-04-19 NOTE — ED Triage Notes (Signed)
Pt came in via POV d/t 3 days of constant/sharp abd pain that makes it difficult to take a deep breath. Does have n/v, no recent fevers. States that when he stands he feels pressure in his bottom like its going to "fall out." Does report daily BM but states they are not regular. A/Ox4, 10/10 pain.

## 2023-04-19 NOTE — Anesthesia Preprocedure Evaluation (Signed)
Anesthesia Evaluation  Patient identified by MRN, date of birth, ID band Patient awake    Reviewed: Allergy & Precautions, NPO status , Patient's Chart, lab work & pertinent test results  Airway Mallampati: II  TM Distance: >3 FB Neck ROM: Full    Dental  (+) Dental Advisory Given   Pulmonary neg pulmonary ROS   breath sounds clear to auscultation       Cardiovascular negative cardio ROS  Rhythm:Regular Rate:Normal     Neuro/Psych negative neurological ROS     GI/Hepatic Neg liver ROS,,,Acute appendicitis    Endo/Other  negative endocrine ROS    Renal/GU Renal InsufficiencyRenal disease     Musculoskeletal   Abdominal   Peds  Hematology negative hematology ROS (+)   Anesthesia Other Findings   Reproductive/Obstetrics                             Anesthesia Physical Anesthesia Plan  ASA: 2 and emergent  Anesthesia Plan: General   Post-op Pain Management: Ofirmev IV (intra-op)*   Induction: Intravenous  PONV Risk Score and Plan: 2 and Dexamethasone, Ondansetron and Treatment may vary due to age or medical condition  Airway Management Planned: Oral ETT  Additional Equipment: None  Intra-op Plan:   Post-operative Plan: Extubation in OR  Informed Consent: I have reviewed the patients History and Physical, chart, labs and discussed the procedure including the risks, benefits and alternatives for the proposed anesthesia with the patient or authorized representative who has indicated his/her understanding and acceptance.     Dental advisory given  Plan Discussed with: CRNA  Anesthesia Plan Comments:        Anesthesia Quick Evaluation

## 2023-04-19 NOTE — Op Note (Signed)
   Operative Note   Date: 04/19/2023  Procedure: laparoscopic appendectomy  Pre-op diagnosis: acute appendicitis Post-op diagnosis: Grade 1c appendicitis: gangrenous/necrotic appendix without perforation  Indication and clinical history: The patient is a 42 y.o. year old male with acute appendicitis     Surgeon: Diamantina Monks, MD  Anesthesiologist: R. Sampson Goon, MD Anesthesia: General  Findings:  Specimen: appendix EBL: <5cc Drains/Implants: none  Disposition: PACU - hemodynamically stable.  Description of procedure: The patient was positioned supine on the operating room table. Time-out was performed verifying correct patient, procedure, signature of informed consent, and administration of pre-operative antibiotics. General anesthetic induction and intubation were uneventful. Foley catheter insertion was performed and was atraumatic . The abdomen was prepped and draped in the usual sterile fashion. An infra-umbilical incision was made using an open technique using zero vicryl stay sutures on either side of the fascia and a 10mm Hassan port inserted. After establishing pneumoperitoneum, which the patient tolerated well, the abdominal cavity was inspected and no injury of any intra-abdominal structures was identified. Two additional five millimeter ports were placed under direct visualization and using local anesthetic in the suprapubic and left lower quadrant regions. The patient was repositioned to Trendelenburg with the left side down. Further inspection of the right lower quadrant revealed large volume purulent fluid in the pelvis, rught paracolic gutter and over the liver, as well as grade 1c appendicitis. Dissection, mobilization, and identification of the appendix was performed. The appendix was dissected away from its mesoappendix and an endoscopic stapler used to divide the mesoappendix using a vascular load. A bowel load of the endoscopic stapler was used to staple across the  appendix at its base. Both staple lines were inspected and found to be intact and without bleeding. The appendix was placed in an endoscopic specimen retrieval bag, removed via the umbilical port site, and sent to pathology as a permanent specimen. The right lower quadrant was again inspected and hemostasis confirmed. All visualized purulent fluid was suctioned. The suprapubic and left lower quadrant ports were removed under direct visualization and hemostasis confirmed. The umbilical port was removed last after desufflating the abdomen and the fascia re-approximated using the stay sutures. Additional local anesthetic was administered at the umbilical incision site. The skin of all port sites was closed with 4-0 monocryl. Sterile dressings were applied. All sponge and instrument counts were correct at the conclusion of the procedure. The patient was awakened from anesthesia, extubated uneventfully, and transported to the PACU in good condition. There were no complications.   Upon entering the abdomen (organ space), I encountered infection of the appendix and large volume purulent fluid .  CASE DATA:  Type of patient?: DOW CASE (Surgical Hospitalist [facility] Inpatient)  Status of Case? URGENT Add On  Infection Present At Time Of Surgery (PATOS)?  INFECTION of the appendix and large volume purulent fluid   Diamantina Monks, MD General and Trauma Surgery [facility]

## 2023-04-19 NOTE — ED Notes (Signed)
Pt had breakthrough pain 10/10. Given dilaudid. MD made aware. Will continue to monitor.

## 2023-04-19 NOTE — ED Provider Notes (Signed)
EMERGENCY DEPARTMENT AT [facility] Provider Note   CSN: [id number] Arrival date & time: 04/19/23  1315     History  Chief Complaint  Patient presents with   Abdominal Pain    Henry Stuart is a 42 y.o. male.  Pt is a 42 yo male with no significant pmhx.  He's had abd pain since early Wed (4/17) am.  Pt denies fever.  He does have n/v.         Home Medications Prior to Admission medications   Medication Sig Start Date End Date Taking? Authorizing Provider  clotrimazole (CLOTRIMAZOLE ANTI-FUNGAL) 1 % cream Apply 1 application. topically 2 (two) times daily. 05/16/22   Mayer Masker, PA-C      Allergies    Patient has no known allergies.    Review of Systems   Review of Systems  Gastrointestinal:  Positive for abdominal pain.  All other systems reviewed and are negative.   Physical Exam Updated Vital Signs BP 118/62 (BP Location: Right Arm)   Pulse 68   Temp 99.9 F (37.7 C) (Oral)   Resp 20   Ht  (1.981 m)   Wt 93 kg   SpO2 100%   BMI 23.69 kg/m  Physical Exam Vitals and nursing note reviewed.  Constitutional:      Appearance: He is well-developed.  HENT:     Head: Normocephalic and atraumatic.     Mouth/Throat:     Mouth: Mucous membranes are dry.  Eyes:     Extraocular Movements: Extraocular movements intact.     Pupils: Pupils are equal, round, and reactive to light.  Cardiovascular:     Rate and Rhythm: Normal rate and regular rhythm.     Heart sounds: Normal heart sounds.  Pulmonary:     Effort: Pulmonary effort is normal.     Breath sounds: Normal breath sounds.  Abdominal:     General: Abdomen is flat and protuberant. Bowel sounds are normal.     Palpations: Abdomen is soft.     Tenderness: There is abdominal tenderness in the right lower quadrant.  Skin:    General: Skin is warm.     Capillary Refill: Capillary refill takes less than 2 seconds.  Neurological:     General: No focal deficit present.      Mental Status: He is alert and oriented to person, place, and time.  Psychiatric:        Mood and Affect: Mood normal.        Behavior: Behavior normal.     ED Results / Procedures / Treatments   Labs (all labs ordered are listed, but only abnormal results are displayed) Labs Reviewed  COMPREHENSIVE METABOLIC PANEL - Abnormal; Notable for the following components:      Result Value   Sodium 132 (*)    Potassium 3.1 (*)    Glucose, Bld 117 (*)    Creatinine, Ser 1.50 (*)    GFR, Estimated 60 (*)    All other components within normal limits  I-STAT CHEM 8, ED - Abnormal; Notable for the following components:   Potassium 3.2 (*)    Chloride 97 (*)    Creatinine, Ser 1.50 (*)    Glucose, Bld 110 (*)    All other components within normal limits  CBC WITH DIFFERENTIAL/PLATELET  LIPASE, BLOOD  URINALYSIS, W/ REFLEX TO CULTURE (INFECTION SUSPECTED)    EKG None  Radiology CT ABDOMEN PELVIS W CONTRAST  Result Date: 04/19/2023 CLINICAL DATA:  Right lower quadrant pain. EXAM: CT ABDOMEN AND PELVIS WITH CONTRAST TECHNIQUE: Multidetector CT imaging of the abdomen and pelvis was performed using the standard protocol following bolus administration of intravenous contrast. RADIATION DOSE REDUCTION: This exam was performed according to the departmental dose-optimization program which includes automated exposure control, adjustment of the mA and/or kV according to patient size and/or use of iterative reconstruction technique. CONTRAST:  75 mL Omnipaque 350 COMPARISON:  None Available. FINDINGS: Lower Chest: No acute findings. Hepatobiliary: No hepatic masses identified. Gallbladder is unremarkable. No evidence of biliary ductal dilatation. Pancreas:  No mass or inflammatory changes. Spleen: Within normal limits in size and appearance. Adrenals/Urinary Tract: No suspicious masses identified. No evidence of ureteral calculi or hydronephrosis. Stomach/Bowel: Enlarged appendix is seen with findings of  acute appendicitis as follows: Appendix: Location- Standard Diameter-13 mm Appendicolith- Absent Mucosal hyper-enhancement- Present Extraluminal Gas- Absent Periappendiceal Collection-no evidence of abscess, however small amount of free fluid is seen in the right abdomen and pelvis. Vascular/Lymphatic: No pathologically enlarged lymph nodes. No acute vascular findings. Reproductive:  No mass or other significant abnormality. Other:  None. Musculoskeletal:  No suspicious bone lesions identified. IMPRESSION: Positive for acute appendicitis. Small amount of free fluid in right abdomen and pelvis. No evidence of abscess. Electronically Signed   By: Danae Orleans M.D.   On: 04/19/2023 17:42    Procedures Procedures    Medications Ordered in ED Medications  sodium chloride 0.9 % bolus 1,000 mL (has no administration in time range)  cefTRIAXone (ROCEPHIN) 2 g in sodium chloride 0.9 % 100 mL IVPB (has no administration in time range)    And  metroNIDAZOLE (FLAGYL) IVPB 500 mg (has no administration in time range)  HYDROmorphone (DILAUDID) injection 1 mg (has no administration in time range)  ondansetron (ZOFRAN) injection 4 mg (4 mg Intravenous Given 04/19/23 1437)  morphine (PF) 2 MG/ML injection 4 mg (4 mg Intravenous Given 04/19/23 1434)  iohexol (OMNIPAQUE) 350 MG/ML injection 75 mL (75 mLs Intravenous Contrast Given 04/19/23 1738)  HYDROmorphone (DILAUDID) injection 1 mg (1 mg Intravenous Given 04/19/23 1929)    ED Course/ Medical Decision Making/ A&P                             Medical Decision Making Risk Prescription drug management. Decision regarding hospitalization.   This patient presents to the ED for concern of abd pain, this involves an extensive number of treatment options, and is a complaint that carries with it a high risk of complications and morbidity.  The differential diagnosis includes appendicitis, kidney stone, cholecystitis, diverticulitis   Co morbidities that complicate  the patient evaluation  none   Additional history obtained:  Additional history obtained from epic chart review External records from outside source obtained and reviewed including wife   Lab Tests:  I Ordered, and personally interpreted labs.  The pertinent results include:  cbc nl, cmp nl other than k low at 3.1 and cr elevated at 1.5   Imaging Studies ordered:  I ordered imaging studies including ct abd/pelvis  I independently visualized and interpreted imaging which showed  Positive for acute appendicitis.    Small amount of free fluid in right abdomen and pelvis. No evidence  of abscess.   I agree with the radiologist interpretation   Cardiac Monitoring:  The patient was maintained on a cardiac monitor.  I personally viewed and interpreted the cardiac monitored which showed an underlying rhythm of: nsr  Medicines ordered and prescription drug management:  I ordered medication including morphine and dilaudid  for pain/rocephin and flagyl for appy  Reevaluation of the patient after these medicines showed that the patient improved I have reviewed the patients home medicines and have made adjustments as needed   Test Considered:  ct   Critical Interventions:  Pain control/abx   Consultations Obtained:  I requested consultation with the surgeon (Dr. Bedelia Person),  and discussed lab and imaging findings as well as pertinent plan - she will see pt   Problem List / ED Course:  Acute appendicitis:  pt given abx and meds for pain.  Dr. Bedelia Person did see pt and will take him to the OR tonight.   Reevaluation:  After the interventions noted above, I reevaluated the patient and found that they have :improved   Social Determinants of Health:  Lives at home   Dispostion:  After consideration of the diagnostic results and the patients response to treatment, I feel that the patent would benefit from admission.          Final Clinical Impression(s) / ED  Diagnoses Final diagnoses:  Acute appendicitis, unspecified acute appendicitis type    Rx / DC Orders ED Discharge Orders     None         Jacalyn Lefevre, MD 04/19/23 2040

## 2023-04-19 NOTE — H&P (Signed)
Reason for Consult/Chief Complaint: appendicitis Consultant: Particia Nearing, MD  Henry Stuart is an 42 y.o. male.   HPI: 15M with abdominal pain that began the evening of 4/16. Reports pain kept him up throughout the night. Pain acutely worsened mid-day today. Denies any other symptoms such as nausea, vomiting, fevers. BMs changed and he reports they were more frequent, smaller volume, and firm. Denies prior history of colonoscopy.   History reviewed. No pertinent past medical history.  Past Surgical History:  Procedure Laterality Date   KNEE ARTHROPLASTY Left    WRIST SURGERY Right     Family History  Problem Relation Age of Onset   Cancer Mother        GYN   Cancer Father        esophageal   Healthy Sister    Healthy Brother    Healthy Daughter    Healthy Son    Healthy Son    Healthy Son    Healthy Sister    Healthy Sister    Healthy Sister    Healthy Sister    Healthy Sister    Healthy Brother    Healthy Brother    Healthy Brother    Healthy Brother     Social History:  reports that he has never smoked. He has never used smokeless tobacco. He reports current alcohol use of about 5.0 standard drinks of alcohol per week. He reports that he does not use drugs.  Allergies: No Known Allergies  Medications: I have reviewed the patient's current medications.  Results for orders placed or performed during the hospital encounter of 04/19/23 (from the past 48 hour(s))  CBC with Differential     Status: None   Collection Time: 04/19/23  2:52 PM  Result Value Ref Range   WBC 10.0 4.0 - 10.5 K/uL   RBC 4.46 4.22 - 5.81 MIL/uL   Hemoglobin 14.1 13.0 - 17.0 g/dL   HCT 16.1 09.6 - 04.5 %   MCV 91.5 80.0 - 100.0 fL   MCH 31.6 26.0 - 34.0 pg   MCHC 34.6 30.0 - 36.0 g/dL   RDW 40.9 81.1 - 91.4 %   Platelets 195 150 - 400 K/uL   nRBC 0.0 0.0 - 0.2 %   Neutrophils Relative % 75 %   Neutro Abs 7.5 1.7 - 7.7 K/uL   Lymphocytes Relative 20 %   Lymphs Abs 2.0 0.7 - 4.0 K/uL    Monocytes Relative 5 %   Monocytes Absolute 0.5 0.1 - 1.0 K/uL   Eosinophils Relative 0 %   Eosinophils Absolute 0.0 0.0 - 0.5 K/uL   Basophils Relative 0 %   Basophils Absolute 0.0 0.0 - 0.1 K/uL   Immature Granulocytes 0 %   Abs Immature Granulocytes 0.01 0.00 - 0.07 K/uL    Comment: Performed at [facility], [address], [place], [place] [address]  Comprehensive metabolic panel     Status: Abnormal   Collection Time: 04/19/23  2:52 PM  Result Value Ref Range   Sodium 132 (L) 135 - 145 mmol/L   Potassium 3.1 (L) 3.5 - 5.1 mmol/L   Chloride 98 98 - 111 mmol/L   CO2 25 22 - 32 mmol/L   Glucose, Bld 117 (H) 70 - 99 mg/dL    Comment: Glucose reference range applies only to samples taken after fasting for at least 8 hours.   BUN 12 6 - 20 mg/dL   Creatinine, Ser 6.21 (H) 0.61 - 1.24 mg/dL  Calcium 8.9 8.9 - 10.3 mg/dL   Total Protein 7.1 6.5 - 8.1 g/dL   Albumin 4.1 3.5 - 5.0 g/dL   AST 22 15 - 41 U/L   ALT 17 0 - 44 U/L   Alkaline Phosphatase 39 38 - 126 U/L   Total Bilirubin 1.0 0.3 - 1.2 mg/dL   GFR, Estimated 60 (L) >60 mL/min    Comment: (NOTE) Calculated using the CKD-EPI Creatinine Equation (2021)    Anion gap 9 5 - 15    Comment: Performed at [facility], [address] [place], [place] [address]  Lipase, blood     Status: None   Collection Time: 04/19/23  2:52 PM  Result Value Ref Range   Lipase 25 11 - 51 U/L    Comment: Performed at [facility], [address], [place], [place] [address]  I-stat chem 8, ED (not at [facility], [facility] or [facility])     Status: Abnormal   Collection Time: 04/19/23  3:07 PM  Result Value Ref Range   Sodium 136 135 - 145 mmol/L   Potassium 3.2 (L) 3.5 - 5.1 mmol/L   Chloride 97 (L) 98 - 111 mmol/L   BUN 14 6 - 20 mg/dL   Creatinine, Ser 1.02 (H) 0.61 - 1.24 mg/dL   Glucose, Bld 725 (H) 70 - 99 mg/dL    Comment: Glucose reference range applies only to samples taken after fasting for at least 8 hours.   Calcium, Ion 1.18  1.15 - 1.40 mmol/L   TCO2 28 22 - 32 mmol/L   Hemoglobin 15.0 13.0 - 17.0 g/dL   HCT 36.6 44.0 - 34.7 %    CT ABDOMEN PELVIS W CONTRAST  Result Date: 04/19/2023 CLINICAL DATA:  Right lower quadrant pain. EXAM: CT ABDOMEN AND PELVIS WITH CONTRAST TECHNIQUE: Multidetector CT imaging of the abdomen and pelvis was performed using the standard protocol following bolus administration of intravenous contrast. RADIATION DOSE REDUCTION: This exam was performed according to the departmental dose-optimization program which includes automated exposure control, adjustment of the mA and/or kV according to patient size and/or use of iterative reconstruction technique. CONTRAST:  75 mL Omnipaque 350 COMPARISON:  None Available. FINDINGS: Lower Chest: No acute findings. Hepatobiliary: No hepatic masses identified. Gallbladder is unremarkable. No evidence of biliary ductal dilatation. Pancreas:  No mass or inflammatory changes. Spleen: Within normal limits in size and appearance. Adrenals/Urinary Tract: No suspicious masses identified. No evidence of ureteral calculi or hydronephrosis. Stomach/Bowel: Enlarged appendix is seen with findings of acute appendicitis as follows: Appendix: Location- Standard Diameter-13 mm Appendicolith- Absent Mucosal hyper-enhancement- Present Extraluminal Gas- Absent Periappendiceal Collection-no evidence of abscess, however small amount of free fluid is seen in the right abdomen and pelvis. Vascular/Lymphatic: No pathologically enlarged lymph nodes. No acute vascular findings. Reproductive:  No mass or other significant abnormality. Other:  None. Musculoskeletal:  No suspicious bone lesions identified. IMPRESSION: Positive for acute appendicitis. Small amount of free fluid in right abdomen and pelvis. No evidence of abscess. Electronically Signed   By: Danae Orleans M.D.   On: 04/19/2023 17:42    ROS 10 point review of systems is negative except as listed above in HPI.   Physical Exam Blood  pressure 118/62, pulse 68, temperature 99.9 F (37.7 C), temperature source Oral, resp. rate 20, height 6\' 6"  (1.981 m), weight 93 kg, SpO2 100 %. Constitutional: well-developed, well-nourished HEENT: pupils equal, round, reactive to light, 2mm b/l, moist conjunctiva, external inspection of ears and nose  normal, hearing intact Oropharynx: normal oropharyngeal mucosa, normal dentition Neck: no thyromegaly, trachea midline, no midline cervical tenderness to palpation Chest: breath sounds equal bilaterally, normal respiratory effort, no midline or lateral chest wall tenderness to palpation/deformity Abdomen: soft, central and suprapuibic TTP, but also having BLQ TTP, no bruising, no hepatosplenomegaly GU: no blood at urethral meatus of penis, no scrotal masses or abnormality  Back: no wounds, no thoracic/lumbar spine tenderness to palpation, no thoracic/lumbar spine stepoffs Rectal: deferred Extremities: 2+ radial and pedal pulses bilaterally, intact motor and sensation bilateral UE and LE, no peripheral edema MSK: unable to assess gait/station, no clubbing/cyanosis of fingers/toes, normal ROM of all four extremities Skin: warm, dry, no rashes Psych: normal memory, normal mood/affect     Assessment/Plan: 39M with acute appendicitis. Discussed operative and non-operative management options. Patient elects for . Informed consent was obtained after detailed explanation of risks, including bleeding, infection, abscess, staple line leak, stump appendicitis, injury to surrounding structures, and need for conversion to open procedure. All questions answered to the patient's satisfaction.   Diamantina Monks, MD General and Trauma Surgery [facility]

## 2023-04-19 NOTE — Anesthesia Procedure Notes (Signed)
Procedure Name: Intubation Date/Time: 04/19/2023 9:19 PM  Performed by: Sheppard Evens, CRNAPre-anesthesia Checklist: Patient identified, Emergency Drugs available, Suction available and Patient being monitored Patient Re-evaluated:Patient Re-evaluated prior to induction Oxygen Delivery Method: Circle System Utilized Preoxygenation: Pre-oxygenation with 100% oxygen Induction Type: IV induction and Rapid sequence Laryngoscope Size: Mac and 3 Grade View: Grade I Tube type: Oral Tube size: 7.5 mm Number of attempts: 1 Airway Equipment and Method: Stylet and Oral airway Placement Confirmation: ETT inserted through vocal cords under direct vision, positive ETCO2 and breath sounds checked- equal and bilateral Secured at: 23 cm Tube secured with: Tape Dental Injury: Teeth and Oropharynx as per pre-operative assessment

## 2023-04-19 NOTE — Transfer of Care (Signed)
Immediate Anesthesia Transfer of Care Note  Patient: Henry Stuart  Procedure(s) Performed: APPENDECTOMY LAPAROSCOPIC  Patient Location: PACU  Anesthesia Type:General  Level of Consciousness: sedated and responds to stimulation  Airway & Oxygen Therapy: Patient Spontanous Breathing  Post-op Assessment: Report given to RN and Post -op Vital signs reviewed and stable  Post vital signs: Reviewed and stable  Last Vitals:  Vitals Value Taken Time  BP 131/76 04/19/23 2223  Temp    Pulse 95 04/19/23 2227  Resp 13 04/19/23 2227  SpO2 92 % 04/19/23 2227  Vitals shown include unvalidated device data.  Last Pain:  Vitals:   04/19/23 2009  TempSrc:   PainSc: 2          Complications: No notable events documented.

## 2023-04-20 LAB — CREATININE, SERUM
Creatinine, Ser: 1.76 mg/dL — ABNORMAL HIGH (ref 0.61–1.24)
GFR, Estimated: 50 mL/min — ABNORMAL LOW (ref >60)

## 2023-04-20 LAB — CBC
HCT: 38.4 % — ABNORMAL LOW (ref 39.0–52.0)
Hemoglobin: 13.1 g/dL (ref 13.0–17.0)
MCH: 31.4 pg (ref 26.0–34.0)
MCHC: 34.1 g/dL (ref 30.0–36.0)
MCV: 92.1 fL (ref 80.0–100.0)
Platelets: 182 10*3/uL (ref 150–400)
RBC: 4.17 MIL/uL — ABNORMAL LOW (ref 4.22–5.81)
RDW: 11.9 % (ref 11.5–15.5)
WBC: 11.7 10*3/uL — ABNORMAL HIGH (ref 4.0–10.5)
nRBC: 0 % (ref 0.0–0.2)

## 2023-04-20 MED ORDER — METRONIDAZOLE 500 MG/100ML IV SOLN
500.0000 mg | Freq: Two times a day (BID) | INTRAVENOUS | Status: AC
  Administered 2023-04-20: 500 mg via INTRAVENOUS
  Filled 2023-04-20 (×2): qty 100

## 2023-04-20 MED ORDER — TRAMADOL HCL 50 MG PO TABS
50.0000 mg | ORAL_TABLET | Freq: Four times a day (QID) | ORAL | Status: DC | PRN
  Administered 2023-04-20 – 2023-04-23 (×2): 50 mg via ORAL
  Filled 2023-04-20 (×2): qty 1

## 2023-04-20 MED ORDER — ONDANSETRON 4 MG PO TBDP: 4.0000 mg | ORAL_TABLET | Freq: Four times a day (QID) | ORAL | Status: DC | PRN

## 2023-04-20 MED ORDER — PROCHLORPERAZINE EDISYLATE 10 MG/2ML IJ SOLN: 5.0000 mg | Freq: Four times a day (QID) | INTRAMUSCULAR | Status: DC | PRN

## 2023-04-20 MED ORDER — ACETAMINOPHEN 500 MG PO TABS
1000.0000 mg | ORAL_TABLET | Freq: Four times a day (QID) | ORAL | Status: DC
  Administered 2023-04-20 – 2023-04-23 (×11): 1000 mg via ORAL
  Filled 2023-04-20 (×13): qty 2

## 2023-04-20 MED ORDER — KETOROLAC TROMETHAMINE 30 MG/ML IJ SOLN: 30.0000 mg | Freq: Four times a day (QID) | INTRAMUSCULAR | Status: DC | PRN

## 2023-04-20 MED ORDER — HYDROMORPHONE HCL 1 MG/ML IJ SOLN
0.5000 mg | INTRAMUSCULAR | Status: DC | PRN
  Administered 2023-04-20 – 2023-04-22 (×3): 1 mg via INTRAVENOUS
  Filled 2023-04-20 (×4): qty 1

## 2023-04-20 MED ORDER — OXYCODONE HCL 5 MG PO TABS
5.0000 mg | ORAL_TABLET | ORAL | Status: DC | PRN
  Administered 2023-04-20 – 2023-04-21 (×2): 10 mg via ORAL
  Administered 2023-04-21: 5 mg via ORAL
  Administered 2023-04-21 – 2023-04-22 (×4): 10 mg via ORAL
  Filled 2023-04-20: qty 2
  Filled 2023-04-20: qty 1
  Filled 2023-04-20 (×4): qty 2
  Filled 2023-04-20: qty 1
  Filled 2023-04-20: qty 2

## 2023-04-20 MED ORDER — MELATONIN 3 MG PO TABS: 3.0000 mg | ORAL_TABLET | Freq: Every evening | ORAL | Status: DC | PRN

## 2023-04-20 MED ORDER — KETOROLAC TROMETHAMINE 30 MG/ML IJ SOLN
30.0000 mg | Freq: Four times a day (QID) | INTRAMUSCULAR | Status: AC
  Administered 2023-04-20 (×2): 30 mg via INTRAVENOUS
  Filled 2023-04-20 (×2): qty 1

## 2023-04-20 MED ORDER — PROCHLORPERAZINE MALEATE 10 MG PO TABS: 10.0000 mg | ORAL_TABLET | Freq: Four times a day (QID) | ORAL | Status: DC | PRN

## 2023-04-20 MED ORDER — DIPHENHYDRAMINE HCL 50 MG/ML IJ SOLN
12.5000 mg | Freq: Four times a day (QID) | INTRAMUSCULAR | Status: DC | PRN
  Administered 2023-04-21: 12.5 mg via INTRAVENOUS
  Filled 2023-04-20: qty 1

## 2023-04-20 MED ORDER — ENOXAPARIN SODIUM 40 MG/0.4ML IJ SOSY
40.0000 mg | PREFILLED_SYRINGE | Freq: Every day | INTRAMUSCULAR | Status: DC
  Administered 2023-04-20 – 2023-04-24 (×5): 40 mg via SUBCUTANEOUS
  Filled 2023-04-20 (×5): qty 0.4

## 2023-04-20 MED ORDER — DIPHENHYDRAMINE HCL 12.5 MG/5ML PO ELIX: 12.5000 mg | ORAL_SOLUTION | Freq: Four times a day (QID) | ORAL | Status: DC | PRN

## 2023-04-20 MED ORDER — METHOCARBAMOL 500 MG PO TABS
500.0000 mg | ORAL_TABLET | Freq: Four times a day (QID) | ORAL | Status: DC | PRN
  Administered 2023-04-20 – 2023-04-22 (×5): 500 mg via ORAL
  Filled 2023-04-20 (×5): qty 1

## 2023-04-20 MED ORDER — ONDANSETRON HCL 4 MG/2ML IJ SOLN
4.0000 mg | Freq: Four times a day (QID) | INTRAMUSCULAR | Status: DC | PRN
  Administered 2023-04-20 – 2023-04-25 (×6): 4 mg via INTRAVENOUS
  Filled 2023-04-20 (×6): qty 2

## 2023-04-20 MED ORDER — SENNA 8.6 MG PO TABS
1.0000 | ORAL_TABLET | Freq: Two times a day (BID) | ORAL | Status: DC
  Administered 2023-04-20 – 2023-04-26 (×10): 8.6 mg via ORAL
  Filled 2023-04-20 (×10): qty 1

## 2023-04-20 NOTE — Progress Notes (Signed)
General Surgery Follow Up Note  Subjective:    Overnight Issues:   Objective:  Vital signs for last 24 hours: Temp:  [97.8 F (36.6 C)-100.4 F (38 C)] 98.3 F (36.8 C) (04/20 0745) Pulse Rate:  [68-99] 75 (04/20 0745) Resp:  [11-22] 16 (04/20 0745) BP: (108-131)/(51-83) 113/69 (04/20 0745) SpO2:  [91 %-100 %] 100 % (04/20 0745) Weight:  [93 kg] 93 kg (04/19 1421)  Hemodynamic parameters for last 24 hours:    Intake/Output from previous day: 04/19 0701 - 04/20 0700 In: 300 [IV Piggyback:300] Out: 1000 [Urine:1000]  Intake/Output this shift: No intake/output data recorded.  Vent settings for last 24 hours:    Physical Exam:  Gen: comfortable, no distress Neuro: follows commands, alert, communicative HEENT: PERRL Neck: supple CV: RRR Pulm: unlabored breathing on RA Abd: soft, NT, incision clean, dry, intact  GU: urine clear and yellow, +spontaneous void Extr: wwp, no edema  Results for orders placed or performed during the hospital encounter of 04/19/23 (from the past 24 hour(s))  CBC with Differential     Status: None   Collection Time: 04/19/23  2:52 PM  Result Value Ref Range   WBC 10.0 4.0 - 10.5 K/uL   RBC 4.46 4.22 - 5.81 MIL/uL   Hemoglobin 14.1 13.0 - 17.0 g/dL   HCT 16.1 09.6 - 04.5 %   MCV 91.5 80.0 - 100.0 fL   MCH 31.6 26.0 - 34.0 pg   MCHC 34.6 30.0 - 36.0 g/dL   RDW 40.9 81.1 - 91.4 %   Platelets 195 150 - 400 K/uL   nRBC 0.0 0.0 - 0.2 %   Neutrophils Relative % 75 %   Neutro Abs 7.5 1.7 - 7.7 K/uL   Lymphocytes Relative 20 %   Lymphs Abs 2.0 0.7 - 4.0 K/uL   Monocytes Relative 5 %   Monocytes Absolute 0.5 0.1 - 1.0 K/uL   Eosinophils Relative 0 %   Eosinophils Absolute 0.0 0.0 - 0.5 K/uL   Basophils Relative 0 %   Basophils Absolute 0.0 0.0 - 0.1 K/uL   Immature Granulocytes 0 %   Abs Immature Granulocytes 0.01 0.00 - 0.07 K/uL  Comprehensive metabolic panel     Status: Abnormal   Collection Time: 04/19/23  2:52 PM  Result Value  Ref Range   Sodium 132 (L) 135 - 145 mmol/L   Potassium 3.1 (L) 3.5 - 5.1 mmol/L   Chloride 98 98 - 111 mmol/L   CO2 25 22 - 32 mmol/L   Glucose, Bld 117 (H) 70 - 99 mg/dL   BUN 12 6 - 20 mg/dL   Creatinine, Ser 7.82 (H) 0.61 - 1.24 mg/dL   Calcium 8.9 8.9 - 95.6 mg/dL   Total Protein 7.1 6.5 - 8.1 g/dL   Albumin 4.1 3.5 - 5.0 g/dL   AST 22 15 - 41 U/L   ALT 17 0 - 44 U/L   Alkaline Phosphatase 39 38 - 126 U/L   Total Bilirubin 1.0 0.3 - 1.2 mg/dL   GFR, Estimated 60 (L) >60 mL/min   Anion gap 9 5 - 15  Lipase, blood     Status: None   Collection Time: 04/19/23  2:52 PM  Result Value Ref Range   Lipase 25 11 - 51 U/L  I-stat chem 8, ED (not at [facility], [facility] or [facility])     Status: Abnormal   Collection Time: 04/19/23  3:07 PM  Result Value Ref Range   Sodium 136 135 - 145  mmol/L   Potassium 3.2 (L) 3.5 - 5.1 mmol/L   Chloride 97 (L) 98 - 111 mmol/L   BUN 14 6 - 20 mg/dL   Creatinine, Ser 1.61 (H) 0.61 - 1.24 mg/dL   Glucose, Bld 096 (H) 70 - 99 mg/dL   Calcium, Ion 0.45 4.09 - 1.40 mmol/L   TCO2 28 22 - 32 mmol/L   Hemoglobin 15.0 13.0 - 17.0 g/dL   HCT 81.1 91.4 - 78.2 %  CBC     Status: Abnormal   Collection Time: 04/20/23  1:28 AM  Result Value Ref Range   WBC 11.7 (H) 4.0 - 10.5 K/uL   RBC 4.17 (L) 4.22 - 5.81 MIL/uL   Hemoglobin 13.1 13.0 - 17.0 g/dL   HCT 95.6 (L) 21.3 - 08.6 %   MCV 92.1 80.0 - 100.0 fL   MCH 31.4 26.0 - 34.0 pg   MCHC 34.1 30.0 - 36.0 g/dL   RDW 57.8 46.9 - 62.9 %   Platelets 182 150 - 400 K/uL   nRBC 0.0 0.0 - 0.2 %  Creatinine, serum     Status: Abnormal   Collection Time: 04/20/23  1:28 AM  Result Value Ref Range   Creatinine, Ser 1.76 (H) 0.61 - 1.24 mg/dL   GFR, Estimated 50 (L) >60 mL/min    Assessment & Plan: Present on Admission:  Acute appendicitis    LOS: 0 days   Additional comments:I reviewed the patient's new clinical lab test results.   and I reviewed the patients new imaging test results.    Acute appendicitis - s/p lap  appy 4/19 FEN - regular diet DVT - SCDs, LMWH Dispo - med-surg ,okay for d/c if tol diet, ambulatory, pain controlled with pO pain meds  Diamantina Monks, MD Trauma & General Surgery Please use [email or web address] to contact on call provider  04/20/2023  *Care during the described time interval was provided by me. I have reviewed this patient's available data, including medical history, events of note, physical examination and test results as part of my evaluation.

## 2023-04-21 DIAGNOSIS — K3580 Unspecified acute appendicitis: Secondary | ICD-10-CM | POA: Diagnosis present

## 2023-04-21 DIAGNOSIS — K567 Ileus, unspecified: Secondary | ICD-10-CM | POA: Diagnosis not present

## 2023-04-21 DIAGNOSIS — K35891 Other acute appendicitis without perforation, with gangrene: Secondary | ICD-10-CM | POA: Diagnosis present

## 2023-04-21 DIAGNOSIS — K9189 Other postprocedural complications and disorders of digestive system: Secondary | ICD-10-CM | POA: Diagnosis not present

## 2023-04-21 DIAGNOSIS — N179 Acute kidney failure, unspecified: Secondary | ICD-10-CM | POA: Diagnosis not present

## 2023-04-21 DIAGNOSIS — Z96652 Presence of left artificial knee joint: Secondary | ICD-10-CM | POA: Diagnosis present

## 2023-04-21 MED ORDER — PIPERACILLIN-TAZOBACTAM 3.375 G IVPB
3.3750 g | Freq: Three times a day (TID) | INTRAVENOUS | Status: DC
  Administered 2023-04-21 – 2023-04-26 (×15): 3.375 g via INTRAVENOUS
  Filled 2023-04-21 (×15): qty 50

## 2023-04-21 MED ORDER — PIPERACILLIN-TAZOBACTAM 3.375 G IVPB 30 MIN: 3.3750 g | Freq: Three times a day (TID) | INTRAVENOUS | Status: DC

## 2023-04-21 MED ORDER — POTASSIUM CHLORIDE IN NACL 20-0.9 MEQ/L-% IV SOLN
INTRAVENOUS | Status: DC
  Filled 2023-04-21 (×2): qty 1000

## 2023-04-21 NOTE — Anesthesia Postprocedure Evaluation (Signed)
Anesthesia Post Note  Patient: Henry Stuart  Procedure(s) Performed: APPENDECTOMY LAPAROSCOPIC (Abdomen)     Patient location during evaluation: PACU Anesthesia Type: General Level of consciousness: awake and alert Pain management: pain level controlled Vital Signs Assessment: post-procedure vital signs reviewed and stable Respiratory status: spontaneous breathing, nonlabored ventilation, respiratory function stable and patient connected to nasal cannula oxygen Cardiovascular status: blood pressure returned to baseline and stable Postop Assessment: no apparent nausea or vomiting Anesthetic complications: no   No notable events documented.  Last Vitals:  Vitals:   04/20/23 2047 04/21/23 0314  BP: 108/75 115/78  Pulse: 62 69  Resp:  18  Temp:  36.7 C  SpO2: 100% 98%    Last Pain:  Vitals:   04/21/23 0331  TempSrc:   PainSc: 5                  Kennieth Rad

## 2023-04-21 NOTE — Progress Notes (Signed)
Pt was uncomfortable most of the night with intermittent sharp abdominal pain not relieved well PO meds. IV Dilaudid given once. Pt has tolerated PO liquids but he is not tolerating a reg diet without N/V activated by food smells. Pt encouraged PO intake. Pt tolerates liquid better. Safety maintained all personal items are within reach.

## 2023-04-21 NOTE — Progress Notes (Signed)
2 Days Post-Op   Subjective/Chief Complaint: Nausea and vomiting overnight Feels better this morning No flatus   Objective: Vital signs in last 24 hours: Temp:  [98.1 F (36.7 C)-98.7 F (37.1 C)] 98.5 F (36.9 C) (04/21 0813) Pulse Rate:  [58-69] 65 (04/21 0813) Resp:  [16-20] 20 (04/21 0813) BP: (104-115)/(60-78) 109/60 (04/21 0813) SpO2:  [98 %-100 %] 100 % (04/21 0813) Last BM Date : 04/20/23  Intake/Output from previous day: No intake/output data recorded. Intake/Output this shift: No intake/output data recorded.  Exam: Awake and alert Abdomen a little distended, appropriately tender  Lab Results:  Recent Labs    04/19/23 1452 04/19/23 1507 04/20/23 0128  WBC 10.0  --  11.7*  HGB 14.1 15.0 13.1  HCT 40.8 44.0 38.4*  PLT 195  --  182   BMET Recent Labs    04/19/23 1452 04/19/23 1507 04/20/23 0128  NA 132* 136  --   K 3.1* 3.2*  --   CL 98 97*  --   CO2 25  --   --   GLUCOSE 117* 110*  --   BUN 12 14  --   CREATININE 1.50* 1.50* 1.76*  CALCIUM 8.9  --   --    PT/INR No results for input(s): "LABPROT", "INR" in the last 72 hours. ABG No results for input(s): "PHART", "HCO3" in the last 72 hours.  Invalid input(s): "PCO2", "PO2"  Studies/Results: CT ABDOMEN PELVIS W CONTRAST  Result Date: 04/19/2023 CLINICAL DATA:  Right lower quadrant pain. EXAM: CT ABDOMEN AND PELVIS WITH CONTRAST TECHNIQUE: Multidetector CT imaging of the abdomen and pelvis was performed using the standard protocol following bolus administration of intravenous contrast. RADIATION DOSE REDUCTION: This exam was performed according to the departmental dose-optimization program which includes automated exposure control, adjustment of the mA and/or kV according to patient size and/or use of iterative reconstruction technique. CONTRAST:  75 mL Omnipaque 350 COMPARISON:  None Available. FINDINGS: Lower Chest: No acute findings. Hepatobiliary: No hepatic masses identified. Gallbladder is  unremarkable. No evidence of biliary ductal dilatation. Pancreas:  No mass or inflammatory changes. Spleen: Within normal limits in size and appearance. Adrenals/Urinary Tract: No suspicious masses identified. No evidence of ureteral calculi or hydronephrosis. Stomach/Bowel: Enlarged appendix is seen with findings of acute appendicitis as follows: Appendix: Location- Standard Diameter-13 mm Appendicolith- Absent Mucosal hyper-enhancement- Present Extraluminal Gas- Absent Periappendiceal Collection-no evidence of abscess, however small amount of free fluid is seen in the right abdomen and pelvis. Vascular/Lymphatic: No pathologically enlarged lymph nodes. No acute vascular findings. Reproductive:  No mass or other significant abnormality. Other:  None. Musculoskeletal:  No suspicious bone lesions identified. IMPRESSION: Positive for acute appendicitis. Small amount of free fluid in right abdomen and pelvis. No evidence of abscess. Electronically Signed   By: Danae Orleans M.D.   On: 04/19/2023 17:42    Anti-infectives: Anti-infectives (From admission, onward)    Start     Dose/Rate Route Frequency Ordered Stop   04/20/23 0800  metroNIDAZOLE (FLAGYL) IVPB 500 mg        500 mg 100 mL/hr over 60 Minutes Intravenous Every 12 hours 04/20/23 0011 04/20/23 1959   04/19/23 1930  cefTRIAXone (ROCEPHIN) 2 g in sodium chloride 0.9 % 100 mL IVPB       See Hyperspace for full Linked Orders Report.   2 g 200 mL/hr over 30 Minutes Intravenous  Once 04/19/23 1925 04/19/23 2125   04/19/23 1930  metroNIDAZOLE (FLAGYL) IVPB 500 mg  See Hyperspace for full Linked Orders Report.   500 mg 100 mL/hr over 60 Minutes Intravenous  Once 04/19/23 1925 04/19/23 2117       Assessment/Plan: S/p laparoscopic appendectomy for appendicitis  Post op ileus Will resume IV fluids Repeat labs in the morning Keep on clears  LOS: 0 days    Henry Stuart 04/21/2023

## 2023-04-22 ENCOUNTER — Inpatient Hospital Stay (HOSPITAL_COMMUNITY): Payer: BC Managed Care – PPO

## 2023-04-22 ENCOUNTER — Encounter (HOSPITAL_COMMUNITY): Payer: Self-pay | Admitting: Surgery

## 2023-04-22 LAB — CBC
HCT: 33.2 % — ABNORMAL LOW (ref 39.0–52.0)
Hemoglobin: 10.6 g/dL — ABNORMAL LOW (ref 13.0–17.0)
MCH: 30.7 pg (ref 26.0–34.0)
MCHC: 31.9 g/dL (ref 30.0–36.0)
MCV: 96.2 fL (ref 80.0–100.0)
Platelets: 159 10*3/uL (ref 150–400)
RBC: 3.45 MIL/uL — ABNORMAL LOW (ref 4.22–5.81)
RDW: 12 % (ref 11.5–15.5)
WBC: 11.6 10*3/uL — ABNORMAL HIGH (ref 4.0–10.5)
nRBC: 0 % (ref 0.0–0.2)

## 2023-04-22 LAB — BASIC METABOLIC PANEL
Anion gap: 7 (ref 5–15)
BUN: 12 mg/dL (ref 6–20)
CO2: 28 mmol/L (ref 22–32)
Calcium: 8.3 mg/dL — ABNORMAL LOW (ref 8.9–10.3)
Chloride: 97 mmol/L — ABNORMAL LOW (ref 98–111)
Creatinine, Ser: 1.64 mg/dL — ABNORMAL HIGH (ref 0.61–1.24)
GFR, Estimated: 54 mL/min — ABNORMAL LOW (ref >60)
Glucose, Bld: 110 mg/dL — ABNORMAL HIGH (ref 70–99)
Potassium: 3.6 mmol/L (ref 3.5–5.1)
Sodium: 132 mmol/L — ABNORMAL LOW (ref 135–145)

## 2023-04-22 MED ORDER — DOCUSATE SODIUM 100 MG PO CAPS
100.0000 mg | ORAL_CAPSULE | Freq: Two times a day (BID) | ORAL | Status: DC
  Administered 2023-04-22 – 2023-04-26 (×6): 100 mg via ORAL
  Filled 2023-04-22 (×7): qty 1

## 2023-04-22 MED ORDER — BOOST / RESOURCE BREEZE PO LIQD CUSTOM
1.0000 | Freq: Three times a day (TID) | ORAL | Status: DC
  Administered 2023-04-22 – 2023-04-26 (×6): 1 via ORAL

## 2023-04-22 NOTE — Progress Notes (Addendum)
RN called to bedside for Pt to report chest tightness and heaviness. Vitals taken. Temp 101.2 BP 115/73 HR 55. Pt is alert and oriented, has walked in the hallway this shift and is ambulatory in room when he is not in pain. Pain has been controlled tonight with PO meds. IVF Normal Saline with K CL infusing. RN stopped IVF and called provider to report. Lab at bedside to draw labs. Acetaminophen 1000 mg given at 0117. Awaiting new orders and will continue to monitor for acute changes.   EKG complete. MD notified.   Pt reports relief after stopping IVF NS with KCL.  Orders for chest xray. RN will continue to monitor.   Vitals rechecked and WNL. Pt reports no chest tightness at this time.

## 2023-04-22 NOTE — Progress Notes (Addendum)
[facility] Progress Note  3 Days Post-Op  Subjective: CC-  Feeling better today, but still bloated. Denies any n/v. Tolerating liquids. Passing flatus and had a BM. Ambulated multiple times yesterday. He does report some abdominal pain with deep inspiration, but denies CP or SOB. Denies cough or dysuria.  Objective: Vital signs in last 24 hours: Temp:  [98.1 F (36.7 C)-101.2 F (38.4 C)] 98.1 F (36.7 C) (04/22 0745) Pulse Rate:  [55-83] 83 (04/22 0745) Resp:  [16-19] 16 (04/22 0745) BP: (102-124)/(68-76) 124/76 (04/22 0745) SpO2:  [99 %-100 %] 100 % (04/22 0745) Last BM Date : 04/20/23  Intake/Output from previous day: 04/21 0701 - 04/22 0700 In: 1292 [I.V.:1187; IV Piggyback:105] Out: -  Intake/Output this shift: No intake/output data recorded.  PE: Gen:  Alert, NAD, pleasant Abd: distended but soft, hypoactive bowel sounds, mild diffuse tenderness without rebound or guarding, incisions cdi with dermabond present  Lab Results:  Recent Labs    04/20/23 0128 04/22/23 0112  WBC 11.7* 11.6*  HGB 13.1 10.6*  HCT 38.4* 33.2*  PLT 182 159   BMET Recent Labs    04/19/23 1452 04/19/23 1507 04/20/23 0128 04/22/23 0112  NA 132* 136  --  132*  K 3.1* 3.2*  --  3.6  CL 98 97*  --  97*  CO2 25  --   --  28  GLUCOSE 117* 110*  --  110*  BUN 12 14  --  12  CREATININE 1.50* 1.50* 1.76* 1.64*  CALCIUM 8.9  --   --  8.3*   PT/INR No results for input(s): "LABPROT", "INR" in the last 72 hours. CMP     Component Value Date/Time   NA 132 (L) 04/22/2023 0112   NA 140 06/04/2022 0806   K 3.6 04/22/2023 0112   CL 97 (L) 04/22/2023 0112   CO2 28 04/22/2023 0112   GLUCOSE 110 (H) 04/22/2023 0112   BUN 12 04/22/2023 0112   BUN 11 06/04/2022 0806   CREATININE 1.64 (H) 04/22/2023 0112   CALCIUM 8.3 (L) 04/22/2023 0112   PROT 7.1 04/19/2023 1452   PROT 6.9 06/04/2022 0806   ALBUMIN 4.1 04/19/2023 1452   ALBUMIN 4.5 06/04/2022 0806   AST 22 04/19/2023 1452    ALT 17 04/19/2023 1452   ALKPHOS 39 04/19/2023 1452   BILITOT 1.0 04/19/2023 1452   BILITOT 0.7 06/04/2022 0806   GFRNONAA 54 (L) 04/22/2023 0112   GFRAA 71 01/15/2018 0839   Lipase     Component Value Date/Time   LIPASE 25 04/19/2023 1452       Studies/Results: DG CHEST PORT 1 VIEW  Result Date: 04/22/2023 CLINICAL DATA:  Chest tightness. Acute appendicitis diagnosed on 04/19/2023 CT. EXAM: PORTABLE CHEST 1 VIEW COMPARISON:  AP chest 08/08/2009 FINDINGS: Cardiac silhouette and mediastinal contours are within normal limits. The lungs are clear. No pleural effusion or pneumothorax. No acute skeletal abnormality. IMPRESSION: No active disease. Electronically Signed   By: Neita Garnet M.D.   On: 04/22/2023 08:32    Anti-infectives: Anti-infectives (From admission, onward)    Start     Dose/Rate Route Frequency Ordered Stop   04/21/23 1400  piperacillin-tazobactam (ZOSYN) IVPB 3.375 g  Status:  Discontinued        3.375 g 100 mL/hr over 30 Minutes Intravenous Every 8 hours 04/21/23 1105 04/21/23 1108   04/21/23 1200  piperacillin-tazobactam (ZOSYN) IVPB 3.375 g        3.375 g 12.5 mL/hr over 240 Minutes Intravenous  Every 8 hours 04/21/23 1109     04/20/23 0800  metroNIDAZOLE (FLAGYL) IVPB 500 mg        500 mg 100 mL/hr over 60 Minutes Intravenous Every 12 hours 04/20/23 0011 04/20/23 1959   04/19/23 1930  cefTRIAXone (ROCEPHIN) 2 g in sodium chloride 0.9 % 100 mL IVPB       See Hyperspace for full Linked Orders Report.   2 g 200 mL/hr over 30 Minutes Intravenous  Once 04/19/23 1925 04/19/23 2125   04/19/23 1930  metroNIDAZOLE (FLAGYL) IVPB 500 mg       See Hyperspace for full Linked Orders Report.   500 mg 100 mL/hr over 60 Minutes Intravenous  Once 04/19/23 1925 04/19/23 2117        Assessment/Plan Gangrenous/necrotic appendix without perforation  -POD#3 s/p laparoscopic appendectomy 4/19 Dr. Bedelia Person - ok for full liquids but take it slow - continue ambulating -  WBC stable at 11.6, TMAX 101.2. Continue IV abx and monitor. May need CT scan around POD#5 to evaluate for abscess if not improving.  ID - rocephin/flagyl 4/19, zosyn 4/21>> FEN - IVF, FLD VTE - SCDs, lovenox Foley - none and voiding  AKI - Cr 1.64, improving. Continue IVF   LOS: 1 day    Franne Forts, [facility] 04/22/2023, 8:43 AM Please see Amion for pager number during day hours 7:00am-4:30pm

## 2023-04-22 NOTE — Progress Notes (Signed)
   04/22/23 1200  Mobility  Activity Ambulated with assistance in hallway  Level of Assistance Modified independent, requires aide device or extra time  Assistive Device  (IV Pole)  Distance Ambulated (ft) 550 ft  Activity Response Tolerated well  Mobility Referral Yes  $Mobility charge 1 Mobility   Mobility Specialist Progress Note  Pt was in bed and agreeable. Had no c/o pain. Returned to bed w/ all needs met and call bell in reach.   Anastasia Pall Mobility Specialist  Please contact via SecureChat or Rehab office at [phone]

## 2023-04-23 LAB — BASIC METABOLIC PANEL
Anion gap: 6 (ref 5–15)
BUN: 11 mg/dL (ref 6–20)
CO2: 29 mmol/L (ref 22–32)
Calcium: 8.7 mg/dL — ABNORMAL LOW (ref 8.9–10.3)
Chloride: 97 mmol/L — ABNORMAL LOW (ref 98–111)
Creatinine, Ser: 1.65 mg/dL — ABNORMAL HIGH (ref 0.61–1.24)
GFR, Estimated: 54 mL/min — ABNORMAL LOW (ref >60)
Glucose, Bld: 117 mg/dL — ABNORMAL HIGH (ref 70–99)
Potassium: 3.2 mmol/L — ABNORMAL LOW (ref 3.5–5.1)
Sodium: 132 mmol/L — ABNORMAL LOW (ref 135–145)

## 2023-04-23 LAB — CBC
HCT: 33.4 % — ABNORMAL LOW (ref 39.0–52.0)
Hemoglobin: 10.8 g/dL — ABNORMAL LOW (ref 13.0–17.0)
MCH: 31 pg (ref 26.0–34.0)
MCHC: 32.3 g/dL (ref 30.0–36.0)
MCV: 96 fL (ref 80.0–100.0)
Platelets: 194 10*3/uL (ref 150–400)
RBC: 3.48 MIL/uL — ABNORMAL LOW (ref 4.22–5.81)
RDW: 12.2 % (ref 11.5–15.5)
WBC: 9.8 10*3/uL (ref 4.0–10.5)
nRBC: 0 % (ref 0.0–0.2)

## 2023-04-23 LAB — MAGNESIUM: Magnesium: 2.1 mg/dL (ref 1.7–2.4)

## 2023-04-23 LAB — SURGICAL PATHOLOGY

## 2023-04-23 MED ORDER — HYDROMORPHONE HCL 1 MG/ML IJ SOLN
0.5000 mg | INTRAMUSCULAR | Status: DC | PRN
  Administered 2023-04-23 (×2): 0.5 mg via INTRAVENOUS
  Administered 2023-04-23: 1 mg via INTRAVENOUS
  Filled 2023-04-23 (×2): qty 1

## 2023-04-23 MED ORDER — POTASSIUM CHLORIDE 20 MEQ PO PACK
40.0000 meq | PACK | Freq: Two times a day (BID) | ORAL | Status: AC
  Administered 2023-04-23: 40 meq via ORAL
  Filled 2023-04-23: qty 2

## 2023-04-23 MED ORDER — POTASSIUM CHLORIDE CRYS ER 20 MEQ PO TBCR
40.0000 meq | EXTENDED_RELEASE_TABLET | Freq: Two times a day (BID) | ORAL | Status: DC
  Filled 2023-04-23: qty 2

## 2023-04-23 MED ORDER — SODIUM CHLORIDE 0.9 % IV SOLN: INTRAVENOUS | Status: DC

## 2023-04-23 MED ORDER — OXYCODONE HCL 5 MG PO TABS: 5.0000 mg | ORAL_TABLET | ORAL | Status: DC | PRN

## 2023-04-23 NOTE — Progress Notes (Signed)
Patient ID: Henry Stuart, male   DOB: December 15, 1981, 42 y.o.   MRN: [id number] [facility] Surgery Progress Note  4 Days Post-Op  Subjective: CC-  Feeling better today than yesterday. Not as bloated. Denies n/v. Passing flatus and had a BM this morning. Tolerating liquids. Ambulating well.   Objective: Vital signs in last 24 hours: Temp:  [98.1 F (36.7 C)-98.6 F (37 C)] 98.6 F (37 C) (04/23 0846) Pulse Rate:  [59-79] 59 (04/23 0846) Resp:  [15-18] 15 (04/23 0846) BP: (99-118)/(65-68) 118/67 (04/23 0846) SpO2:  [98 %-100 %] 99 % (04/23 0846) Last BM Date : 04/20/23  Intake/Output from previous day: No intake/output data recorded. Intake/Output this shift: No intake/output data recorded.  PE: Gen:  Alert, NAD, pleasant Abd: soft, mild distension, tenderness across the lower abdomen L>R without rebound or guarding, incisions cdi with dermabond present  Lab Results:  Recent Labs    04/22/23 0112 04/23/23 0024  WBC 11.6* 9.8  HGB 10.6* 10.8*  HCT 33.2* 33.4*  PLT 159 194   BMET Recent Labs    04/22/23 0112 04/23/23 0024  NA 132* 132*  K 3.6 3.2*  CL 97* 97*  CO2 28 29  GLUCOSE 110* 117*  BUN 12 11  CREATININE 1.64* 1.65*  CALCIUM 8.3* 8.7*   PT/INR No results for input(s): "LABPROT", "INR" in the last 72 hours. CMP     Component Value Date/Time   NA 132 (L) 04/23/2023 0024   NA 140 06/04/2022 0806   K 3.2 (L) 04/23/2023 0024   CL 97 (L) 04/23/2023 0024   CO2 29 04/23/2023 0024   GLUCOSE 117 (H) 04/23/2023 0024   BUN 11 04/23/2023 0024   BUN 11 06/04/2022 0806   CREATININE 1.65 (H) 04/23/2023 0024   CALCIUM 8.7 (L) 04/23/2023 0024   PROT 7.1 04/19/2023 1452   PROT 6.9 06/04/2022 0806   ALBUMIN 4.1 04/19/2023 1452   ALBUMIN 4.5 06/04/2022 0806   AST 22 04/19/2023 1452   ALT 17 04/19/2023 1452   ALKPHOS 39 04/19/2023 1452   BILITOT 1.0 04/19/2023 1452   BILITOT 0.7 06/04/2022 0806   GFRNONAA 54 (L) 04/23/2023 0024   GFRAA 71 01/15/2018 0839    Lipase     Component Value Date/Time   LIPASE 25 04/19/2023 1452       Studies/Results: DG CHEST PORT 1 VIEW  Result Date: 04/22/2023 CLINICAL DATA:  Chest tightness. Acute appendicitis diagnosed on 04/19/2023 CT. EXAM: PORTABLE CHEST 1 VIEW COMPARISON:  AP chest 08/08/2009 FINDINGS: Cardiac silhouette and mediastinal contours are within normal limits. The lungs are clear. No pleural effusion or pneumothorax. No acute skeletal abnormality. IMPRESSION: No active disease. Electronically Signed   By: Neita Garnet M.D.   On: 04/22/2023 08:32    Anti-infectives: Anti-infectives (From admission, onward)    Start     Dose/Rate Route Frequency Ordered Stop   04/21/23 1400  piperacillin-tazobactam (ZOSYN) IVPB 3.375 g  Status:  Discontinued        3.375 g 100 mL/hr over 30 Minutes Intravenous Every 8 hours 04/21/23 1105 04/21/23 1108   04/21/23 1200  piperacillin-tazobactam (ZOSYN) IVPB 3.375 g        3.375 g 12.5 mL/hr over 240 Minutes Intravenous Every 8 hours 04/21/23 1109     04/20/23 0800  metroNIDAZOLE (FLAGYL) IVPB 500 mg        500 mg 100 mL/hr over 60 Minutes Intravenous Every 12 hours 04/20/23 0011 04/20/23 1959   04/19/23 1930  cefTRIAXone (ROCEPHIN) 2  g in sodium chloride 0.9 % 100 mL IVPB       See Hyperspace for full Linked Orders Report.   2 g 200 mL/hr over 30 Minutes Intravenous  Once 04/19/23 1925 04/19/23 2125   04/19/23 1930  metroNIDAZOLE (FLAGYL) IVPB 500 mg       See Hyperspace for full Linked Orders Report.   500 mg 100 mL/hr over 60 Minutes Intravenous  Once 04/19/23 1925 04/19/23 2117        Assessment/Plan Gangrenous/necrotic appendix without perforation  -POD#4 s/p laparoscopic appendectomy 4/19 Dr. Bedelia Person - WBC normalized today 9.8, afebrile. Continue IV zosyn - ileus improving but still with some intermittent bloating and lower abdominal pain. Will advance to soft diet and monitor. Consider CT scan tomorrow if ileus not fully resolved  - continue  ambulating   ID - rocephin/flagyl 4/19, zosyn 4/21>> FEN - IVF, soft VTE - SCDs, lovenox Foley - none and voiding   AKI - Cr 1.65, improving. Continue IVF. Unsure what his baseline is as Cr was elevated on labs several months ago, he had this worked up by PCP and had been working on diet changes, rec follow up with PCP after discharge    LOS: 2 days    Franne Forts, [facility] Surgery 04/23/2023, 9:37 AM Please see Amion for pager number during day hours 7:00am-4:30pm

## 2023-04-23 NOTE — Progress Notes (Signed)
Pt c/o of very bad cramping in abdomen intermittently all day.  Pain meds given per orders.  Notified Surgery and orders to change diet from soft back to full liquids.  Plans to get CT of abd in morning per MD.    Around 1630 pt had 1 emesis of undigested food, but felt better afterward.  Paged MD on call, orders placed for ng tube and to make NPO.  Pt refused NG tube for now and states "he really would like to see an xray of his stomach".  Notified Md.  Pt now NPO.  Pt walking halls.  Administered nausea meds and will continue to monitor.

## 2023-04-23 NOTE — TOC CM/SW Note (Signed)
  Transition of Care [facility]) Screening Note   Patient Details  Name: Henry Stuart Date of Birth: [birth year]       Transition of Care Department [facility]) has reviewed patient and no TOC needs have been identified at this time. We will continue to monitor patient advancement through interdisciplinary progression rounds. If new patient transition needs arise, please place a TOC consult.

## 2023-04-24 ENCOUNTER — Inpatient Hospital Stay (HOSPITAL_COMMUNITY): Payer: BC Managed Care – PPO

## 2023-04-24 LAB — BASIC METABOLIC PANEL
Anion gap: 9 (ref 5–15)
BUN: 12 mg/dL (ref 6–20)
CO2: 28 mmol/L (ref 22–32)
Calcium: 8.7 mg/dL — ABNORMAL LOW (ref 8.9–10.3)
Chloride: 99 mmol/L (ref 98–111)
Creatinine, Ser: 1.6 mg/dL — ABNORMAL HIGH (ref 0.61–1.24)
GFR, Estimated: 56 mL/min — ABNORMAL LOW (ref >60)
Glucose, Bld: 104 mg/dL — ABNORMAL HIGH (ref 70–99)
Potassium: 3.4 mmol/L — ABNORMAL LOW (ref 3.5–5.1)
Sodium: 136 mmol/L (ref 135–145)

## 2023-04-24 LAB — CBC
HCT: 33.5 % — ABNORMAL LOW (ref 39.0–52.0)
Hemoglobin: 11 g/dL — ABNORMAL LOW (ref 13.0–17.0)
MCH: 31.2 pg (ref 26.0–34.0)
MCHC: 32.8 g/dL (ref 30.0–36.0)
MCV: 94.9 fL (ref 80.0–100.0)
Platelets: 240 10*3/uL (ref 150–400)
RBC: 3.53 MIL/uL — ABNORMAL LOW (ref 4.22–5.81)
RDW: 12.3 % (ref 11.5–15.5)
WBC: 10.2 10*3/uL (ref 4.0–10.5)
nRBC: 0 % (ref 0.0–0.2)

## 2023-04-24 MED ORDER — IOHEXOL 350 MG/ML SOLN
75.0000 mL | Freq: Once | INTRAVENOUS | Status: AC | PRN
  Administered 2023-04-24: 75 mL via INTRAVENOUS

## 2023-04-24 NOTE — Progress Notes (Signed)
Patient ID: Henry Stuart, male   DOB: 30-Oct-1981, 42 y.o.   MRN: [id number] [facility] Surgery Progress Note  5 Days Post-Op  Subjective: Patient vomited large volume yesterday evening after soft diet. Has been NPO since then, declined NGT. Having increased bowel function overnight and reports ~6 BM. Started off as more watery diarrhea and now more solid. Denies nausea or vomiting this AM and feels thirsty. Still feeling bloated though. Ambulating in halls frequently. Wife at bedside as well.   Objective: Vital signs in last 24 hours: Temp:  [98.2 F (36.8 C)-99.7 F (37.6 C)] 99.4 F (37.4 C) (04/24 0807) Pulse Rate:  [73-81] 81 (04/24 0807) Resp:  [15-18] 15 (04/24 0807) BP: (107-119)/(61-78) 115/63 (04/24 0807) SpO2:  [98 %-100 %] 98 % (04/24 0807) Last BM Date : 04/23/23  Intake/Output from previous day: 04/23 0701 - 04/24 0700 In: 592.4 [P.O.:240; I.V.:107.3; IV Piggyback:245.1] Out: 2 [Urine:2] Intake/Output this shift: No intake/output data recorded.  PE: Gen:  Alert, NAD, pleasant Abd: soft, mild distension, tenderness across the lower abdomen L>R without rebound or guarding, incisions cdi with dermabond present  Lab Results:  Recent Labs    04/23/23 0024 04/24/23 0057  WBC 9.8 10.2  HGB 10.8* 11.0*  HCT 33.4* 33.5*  PLT 194 240    BMET Recent Labs    04/23/23 0024 04/24/23 0057  NA 132* 136  K 3.2* 3.4*  CL 97* 99  CO2 29 28  GLUCOSE 117* 104*  BUN 11 12  CREATININE 1.65* 1.60*  CALCIUM 8.7* 8.7*    PT/INR No results for input(s): "LABPROT", "INR" in the last 72 hours. CMP     Component Value Date/Time   NA 136 04/24/2023 0057   NA 140 06/04/2022 0806   K 3.4 (L) 04/24/2023 0057   CL 99 04/24/2023 0057   CO2 28 04/24/2023 0057   GLUCOSE 104 (H) 04/24/2023 0057   BUN 12 04/24/2023 0057   BUN 11 06/04/2022 0806   CREATININE 1.60 (H) 04/24/2023 0057   CALCIUM 8.7 (L) 04/24/2023 0057   PROT 7.1 04/19/2023 1452   PROT 6.9 06/04/2022  0806   ALBUMIN 4.1 04/19/2023 1452   ALBUMIN 4.5 06/04/2022 0806   AST 22 04/19/2023 1452   ALT 17 04/19/2023 1452   ALKPHOS 39 04/19/2023 1452   BILITOT 1.0 04/19/2023 1452   BILITOT 0.7 06/04/2022 0806   GFRNONAA 56 (L) 04/24/2023 0057   GFRAA 71 01/15/2018 0839   Lipase     Component Value Date/Time   LIPASE 25 04/19/2023 1452       Studies/Results: No results found.  Anti-infectives: Anti-infectives (From admission, onward)    Start     Dose/Rate Route Frequency Ordered Stop   04/21/23 1400  piperacillin-tazobactam (ZOSYN) IVPB 3.375 g  Status:  Discontinued        3.375 g 100 mL/hr over 30 Minutes Intravenous Every 8 hours 04/21/23 1105 04/21/23 1108   04/21/23 1200  piperacillin-tazobactam (ZOSYN) IVPB 3.375 g        3.375 g 12.5 mL/hr over 240 Minutes Intravenous Every 8 hours 04/21/23 1109     04/20/23 0800  metroNIDAZOLE (FLAGYL) IVPB 500 mg        500 mg 100 mL/hr over 60 Minutes Intravenous Every 12 hours 04/20/23 0011 04/20/23 1959   04/19/23 1930  cefTRIAXone (ROCEPHIN) 2 g in sodium chloride 0.9 % 100 mL IVPB       See Hyperspace for full Linked Orders Report.   2 g 200  mL/hr over 30 Minutes Intravenous  Once 04/19/23 1925 04/19/23 2125   04/19/23 1930  metroNIDAZOLE (FLAGYL) IVPB 500 mg       See Hyperspace for full Linked Orders Report.   500 mg 100 mL/hr over 60 Minutes Intravenous  Once 04/19/23 1925 04/19/23 2117        Assessment/Plan Gangrenous/necrotic appendix without perforation  -POD#5 s/p laparoscopic appendectomy 4/19 Dr. Bedelia Person - WBC normalized, afebrile. Continue IV zosyn - ileus resolving but still distended - get CT AP with PO and IV contrast today to r/o post-op abscess - continue ambulating   ID - rocephin/flagyl 4/19, zosyn 4/21>> FEN - IVF, NPO VTE - SCDs, lovenox Foley - none and voiding   AKI - Cr 1.60, stable. Continue IVF. Unsure what his baseline is as Cr was elevated on labs several months ago, he had this worked up  by PCP and had been working on diet changes, rec follow up with PCP after discharge    LOS: 3 days    Juliet Rude, [facility] Surgery 04/24/2023, 9:57 AM Please see Amion for pager number during day hours 7:00am-4:30pm

## 2023-04-25 LAB — PROTIME-INR
INR: 1.2 (ref 0.8–1.2)
Prothrombin Time: 15.3 seconds — ABNORMAL HIGH (ref 11.4–15.2)

## 2023-04-25 MED ORDER — ENOXAPARIN SODIUM 40 MG/0.4ML IJ SOSY
40.0000 mg | PREFILLED_SYRINGE | Freq: Every day | INTRAMUSCULAR | Status: DC
  Administered 2023-04-26: 40 mg via SUBCUTANEOUS
  Filled 2023-04-25: qty 0.4

## 2023-04-25 NOTE — Progress Notes (Signed)
    Post appendectomy abscess CCS request for abscess drain placement  Imaging reviewed with Dr Juliette Alcide  Too high for safe window for access , and looks to be smaller at this point  No IR drain at this point  CCS PA Aware

## 2023-04-25 NOTE — Progress Notes (Addendum)
Patient ID: Henry Stuart, male   DOB: 08-Jan-1981, 42 y.o.   MRN: [id number] [facility] Surgery Progress Note  6 Days Post-Op  Subjective: Patient with one episode vomiting after PO contrast but otherwise no n/v. Having small pudding consistency BMs, not really passing much flatus. Cramping and pressure with BMs but no pain otherwise. Still feels bloated. Some sweating overnight. He is ambulating. Wife at bedside.   Objective: Vital signs in last 24 hours: Temp:  [98.5 F (36.9 C)-99.1 F (37.3 C)] 98.5 F (36.9 C) (04/25 0347) Pulse Rate:  [64-75] 64 (04/25 0613) Resp:  [17-20] 20 (04/25 4259) BP: (122-144)/(56-72) 144/71 (04/25 0613) SpO2:  [99 %-100 %] 100 % (04/25 5638) Last BM Date : 04/24/23  Intake/Output from previous day: No intake/output data recorded. Intake/Output this shift: No intake/output data recorded.  PE: Gen:  Alert, NAD, pleasant Abd: soft, mild distension, No TTP, incisions cdi with dermabond present  Lab Results:  Recent Labs    04/23/23 0024 04/24/23 0057  WBC 9.8 10.2  HGB 10.8* 11.0*  HCT 33.4* 33.5*  PLT 194 240    BMET Recent Labs    04/23/23 0024 04/24/23 0057  NA 132* 136  K 3.2* 3.4*  CL 97* 99  CO2 29 28  GLUCOSE 117* 104*  BUN 11 12  CREATININE 1.65* 1.60*  CALCIUM 8.7* 8.7*    PT/INR Recent Labs    04/25/23 0819  LABPROT 15.3*  INR 1.2   CMP     Component Value Date/Time   NA 136 04/24/2023 0057   NA 140 06/04/2022 0806   K 3.4 (L) 04/24/2023 0057   CL 99 04/24/2023 0057   CO2 28 04/24/2023 0057   GLUCOSE 104 (H) 04/24/2023 0057   BUN 12 04/24/2023 0057   BUN 11 06/04/2022 0806   CREATININE 1.60 (H) 04/24/2023 0057   CALCIUM 8.7 (L) 04/24/2023 0057   PROT 7.1 04/19/2023 1452   PROT 6.9 06/04/2022 0806   ALBUMIN 4.1 04/19/2023 1452   ALBUMIN 4.5 06/04/2022 0806   AST 22 04/19/2023 1452   ALT 17 04/19/2023 1452   ALKPHOS 39 04/19/2023 1452   BILITOT 1.0 04/19/2023 1452   BILITOT 0.7 06/04/2022 0806    GFRNONAA 56 (L) 04/24/2023 0057   GFRAA 71 01/15/2018 0839   Lipase     Component Value Date/Time   LIPASE 25 04/19/2023 1452       Studies/Results: CT ABDOMEN PELVIS W CONTRAST  Result Date: 04/24/2023 CLINICAL DATA:  Abdominal pain, status post appendectomy EXAM: CT ABDOMEN AND PELVIS WITH CONTRAST TECHNIQUE: Multidetector CT imaging of the abdomen and pelvis was performed using the standard protocol following bolus administration of intravenous contrast. RADIATION DOSE REDUCTION: This exam was performed according to the departmental dose-optimization program which includes automated exposure control, adjustment of the mA and/or kV according to patient size and/or use of iterative reconstruction technique. CONTRAST:  75mL OMNIPAQUE IOHEXOL 350 MG/ML SOLN COMPARISON:  04/19/2023 FINDINGS: Lower chest: No acute findings are seen. Hepatobiliary: No focal abnormalities are seen in liver. Gallbladder is unremarkable. Pancreas: No focal abnormalities are seen in the pancreas. Spleen: Unremarkable. Adrenals/Urinary Tract: Adrenals are unremarkable. There is no hydronephrosis. There are patchy foci of decreased cortical enhancement in right kidney. There is homogeneous cortical enhancement in the left kidney. There are no renal or ureteral stones. Urinary bladder is unremarkable. Stomach/Bowel: Stomach is unremarkable. Proximal small bowel loops are not dilated. There is mild dilation of small bowel loops in lower mid abdomen measuring up  to 4 cm in diameter. Distal/terminal ileum is not dilated. Appendix is not seen. There are surgical staples in the pericecal region. There is no significant wall thickening and colon. Vascular/Lymphatic: Vascular structures are unremarkable. There are subcentimeter nodes in mesentery. Reproductive: Unremarkable. Other: Small amount of free fluid is seen in pelvis measuring 5.9 x 2.8 cm. There is interval decrease in size of this fluid in the pelvic cavity. There is  minimal enhancement in the lateral margins of this fluid collection. There are no internal septations or pockets of air. There is interval decrease in ascites in abdomen and pelvis. There is no pneumoperitoneum. Musculoskeletal: Degenerative changes are noted in lower lumbar spine with encroachment of neural foramina. IMPRESSION: Postsurgical changes are noted in pericecal region. There is dilation of few small bowel loops in lower mid abdomen, possibly suggesting ileus. There is 5.9 x 2.8 cm fluid collection in pelvic cavity without thick circumferential wall. There is interval decrease in amount of this fluid collection since 04/19/2023. Findings may suggest seroma or early abscess. Clinical observation and short-term follow-up CT as warranted may be considered. There are patchy areas of decreased cortical enhancement in right kidney. Findings suggest possible acute pyelonephritis. There is no hydronephrosis or perinephric fluid collection. There is no evidence of intestinal obstruction or pneumoperitoneum. Electronically Signed   By: Ernie Avena M.D.   On: 04/24/2023 17:51    Anti-infectives: Anti-infectives (From admission, onward)    Start     Dose/Rate Route Frequency Ordered Stop   04/21/23 1400  piperacillin-tazobactam (ZOSYN) IVPB 3.375 g  Status:  Discontinued        3.375 g 100 mL/hr over 30 Minutes Intravenous Every 8 hours 04/21/23 1105 04/21/23 1108   04/21/23 1200  piperacillin-tazobactam (ZOSYN) IVPB 3.375 g        3.375 g 12.5 mL/hr over 240 Minutes Intravenous Every 8 hours 04/21/23 1109     04/20/23 0800  metroNIDAZOLE (FLAGYL) IVPB 500 mg        500 mg 100 mL/hr over 60 Minutes Intravenous Every 12 hours 04/20/23 0011 04/20/23 1959   04/19/23 1930  cefTRIAXone (ROCEPHIN) 2 g in sodium chloride 0.9 % 100 mL IVPB       See Hyperspace for full Linked Orders Report.   2 g 200 mL/hr over 30 Minutes Intravenous  Once 04/19/23 1925 04/19/23 2125   04/19/23 1930  metroNIDAZOLE  (FLAGYL) IVPB 500 mg       See Hyperspace for full Linked Orders Report.   500 mg 100 mL/hr over 60 Minutes Intravenous  Once 04/19/23 1925 04/19/23 2117        Assessment/Plan Gangrenous/necrotic appendix without perforation  -POD#6 s/p laparoscopic appendectomy 4/19 Dr. Bedelia Person - CT yesterday with mild ileus, 5.9 x 2.8 cm fluid collection in pelvis that is not organized, decrease in amount of fluid from CT 4/19 - WBC normalized, afebrile. Continue IV zosyn - continue ambulating - ok to have FLD today - check AM labs  - ok to go outside   ID - rocephin/flagyl 4/19, zosyn 4/21>> day 4 FEN - FLD VTE - SCDs, lovenox Foley - none and voiding   AKI - Cr 1.60 yesterday, stable. Continue IVF. Unsure what his baseline is as Cr was elevated on labs several months ago, he had this worked up by PCP and had been working on diet changes, rec follow up with PCP after discharge    LOS: 4 days    Juliet Rude, [facility] Surgery 04/25/2023, 8:59  AM Please see Amion for pager number during day hours 7:00am-4:30pm

## 2023-04-25 NOTE — Plan of Care (Signed)
  Problem: Clinical Measurements: Goal: Ability to maintain clinical measurements within normal limits will improve Outcome: Progressing Goal: Will remain free from infection Outcome: Progressing Goal: Diagnostic test results will improve Outcome: Progressing Goal: Respiratory complications will improve Outcome: Progressing Goal: Cardiovascular complication will be avoided Outcome: Progressing   Problem: Activity: Goal: Risk for activity intolerance will decrease Outcome: Completed/Met   Problem: Nutrition: Goal: Adequate nutrition will be maintained Outcome: Progressing   Problem: Coping: Goal: Level of anxiety will decrease Outcome: Progressing   Problem: Elimination: Goal: Will not experience complications related to bowel motility Outcome: Progressing   Problem: Safety: Goal: Ability to remain free from injury will improve Outcome: Progressing   Problem: Skin Integrity: Goal: Risk for impaired skin integrity will decrease Outcome: Completed/Met

## 2023-04-26 LAB — CBC
HCT: 30.8 % — ABNORMAL LOW (ref 39.0–52.0)
Hemoglobin: 10 g/dL — ABNORMAL LOW (ref 13.0–17.0)
MCH: 31.1 pg (ref 26.0–34.0)
MCHC: 32.5 g/dL (ref 30.0–36.0)
MCV: 95.7 fL (ref 80.0–100.0)
Platelets: 281 K/uL (ref 150–400)
RBC: 3.22 MIL/uL — ABNORMAL LOW (ref 4.22–5.81)
RDW: 12.8 % (ref 11.5–15.5)
WBC: 8.7 K/uL (ref 4.0–10.5)
nRBC: 0 % (ref 0.0–0.2)

## 2023-04-26 LAB — CREATININE, SERUM
Creatinine, Ser: 1.37 mg/dL — ABNORMAL HIGH (ref 0.61–1.24)
GFR, Estimated: >60 mL/min (ref >60)

## 2023-04-26 MED ORDER — AMOXICILLIN-POT CLAVULANATE 875-125 MG PO TABS: 1.0000 | ORAL_TABLET | Freq: Two times a day (BID) | ORAL | 0 refills | Status: AC

## 2023-04-26 NOTE — Plan of Care (Signed)

## 2023-04-26 NOTE — Plan of Care (Signed)
  Problem: Education: Goal: Knowledge of General Education information will improve Description: Including pain rating scale, medication(s)/side effects and non-pharmacologic comfort measures 04/26/2023 1504 by Myrle Sheng, RN Outcome: Completed/Met 04/26/2023 0941 by Myrle Sheng, RN Outcome: Progressing   Problem: Health Behavior/Discharge Planning: Goal: Ability to manage health-related needs will improve 04/26/2023 1504 by Myrle Sheng, RN Outcome: Completed/Met 04/26/2023 0941 by Myrle Sheng, RN Outcome: Progressing   Problem: Clinical Measurements: Goal: Ability to maintain clinical measurements within normal limits will improve 04/26/2023 1504 by Myrle Sheng, RN Outcome: Completed/Met 04/26/2023 0941 by Myrle Sheng, RN Outcome: Progressing Goal: Will remain free from infection 04/26/2023 1504 by Myrle Sheng, RN Outcome: Completed/Met 04/26/2023 0941 by Myrle Sheng, RN Outcome: Progressing Goal: Diagnostic test results will improve 04/26/2023 1504 by Myrle Sheng, RN Outcome: Completed/Met 04/26/2023 0941 by Myrle Sheng, RN Outcome: Progressing Goal: Respiratory complications will improve 04/26/2023 1504 by Myrle Sheng, RN Outcome: Completed/Met 04/26/2023 0941 by Myrle Sheng, RN Outcome: Progressing Goal: Cardiovascular complication will be avoided 04/26/2023 1504 by Myrle Sheng, RN Outcome: Completed/Met 04/26/2023 0941 by Myrle Sheng, RN Outcome: Progressing   Problem: Nutrition: Goal: Adequate nutrition will be maintained 04/26/2023 1504 by Myrle Sheng, RN Outcome: Completed/Met 04/26/2023 0941 by Myrle Sheng, RN Outcome: Progressing   Problem: Coping: Goal: Level of anxiety will decrease 04/26/2023 1504 by Myrle Sheng, RN Outcome: Completed/Met 04/26/2023 0941 by Myrle Sheng, RN Outcome: Progressing   Problem: Elimination: Goal: Will not  experience complications related to bowel motility 04/26/2023 1504 by Myrle Sheng, RN Outcome: Completed/Met 04/26/2023 0941 by Myrle Sheng, RN Outcome: Progressing Goal: Will not experience complications related to urinary retention 04/26/2023 1504 by Myrle Sheng, RN Outcome: Completed/Met 04/26/2023 0941 by Myrle Sheng, RN Outcome: Progressing   Problem: Pain Managment: Goal: General experience of comfort will improve 04/26/2023 1504 by Myrle Sheng, RN Outcome: Completed/Met 04/26/2023 0941 by Myrle Sheng, RN Outcome: Progressing   Problem: Safety: Goal: Ability to remain free from injury will improve 04/26/2023 1504 by Myrle Sheng, RN Outcome: Completed/Met 04/26/2023 0941 by Myrle Sheng, RN Outcome: Progressing

## 2023-04-26 NOTE — Progress Notes (Signed)
Discharge instructions given to patient and spouse at bedside. No further questions at this time. Left the unit in no obvious distress alert and oriented x 4 accompanied by transport personnel and wife.

## 2023-04-26 NOTE — Progress Notes (Signed)
Patient ID: Henry Stuart, male   DOB: [birth year], 42 y.o.   MRN: [id number] [facility] Surgery Progress Note  7 Days Post-Op  Subjective: Reports feeling much better. Endorses less bloating. Tolerating a liquid diet without nausea. Still with mild belching but also with some flatus and multiple loose stools. Walking more. States he feels ready to go home.  Objective: Vital signs in last 24 hours: Temp:  [98.1 F (36.7 C)-99.2 F (37.3 C)] 98.1 F (36.7 C) (04/26 0747) Pulse Rate:  [61-80] 62 (04/26 0747) Resp:  [16-18] 16 (04/26 0747) BP: (125-149)/(74-80) 130/80 (04/26 0747) SpO2:  [99 %-100 %] 100 % (04/26 0747) Last BM Date : 04/25/23 (per night RN)  Intake/Output from previous day: No intake/output data recorded. Intake/Output this shift: No intake/output data recorded.  PE: Gen:  Alert, NAD, pleasant Abd: soft, mild distension, No TTP, incisions cdi with dermabond present  Lab Results:  Recent Labs    04/24/23 0057 04/26/23 0726  WBC 10.2 8.7  HGB 11.0* 10.0*  HCT 33.5* 30.8*  PLT 240 281   BMET Recent Labs    04/24/23 0057 04/26/23 0726  NA 136  --   K 3.4*  --   CL 99  --   CO2 28  --   GLUCOSE 104*  --   BUN 12  --   CREATININE 1.60* 1.37*  CALCIUM 8.7*  --    PT/INR Recent Labs    04/25/23 0819  LABPROT 15.3*  INR 1.2   CMP     Component Value Date/Time   NA 136 04/24/2023 0057   NA 140 06/04/2022 0806   K 3.4 (L) 04/24/2023 0057   CL 99 04/24/2023 0057   CO2 28 04/24/2023 0057   GLUCOSE 104 (H) 04/24/2023 0057   BUN 12 04/24/2023 0057   BUN 11 06/04/2022 0806   CREATININE 1.37 (H) 04/26/2023 0726   CALCIUM 8.7 (L) 04/24/2023 0057   PROT 7.1 04/19/2023 1452   PROT 6.9 06/04/2022 0806   ALBUMIN 4.1 04/19/2023 1452   ALBUMIN 4.5 06/04/2022 0806   AST 22 04/19/2023 1452   ALT 17 04/19/2023 1452   ALKPHOS 39 04/19/2023 1452   BILITOT 1.0 04/19/2023 1452   BILITOT 0.7 06/04/2022 0806   GFRNONAA >60 04/26/2023 0726   GFRAA 71  01/15/2018 0839   Lipase     Component Value Date/Time   LIPASE 25 04/19/2023 1452       Studies/Results: CT ABDOMEN PELVIS W CONTRAST  Result Date: 04/24/2023 CLINICAL DATA:  Abdominal pain, status post appendectomy EXAM: CT ABDOMEN AND PELVIS WITH CONTRAST TECHNIQUE: Multidetector CT imaging of the abdomen and pelvis was performed using the standard protocol following bolus administration of intravenous contrast. RADIATION DOSE REDUCTION: This exam was performed according to the departmental dose-optimization program which includes automated exposure control, adjustment of the mA and/or kV according to patient size and/or use of iterative reconstruction technique. CONTRAST:  75mL OMNIPAQUE IOHEXOL 350 MG/ML SOLN COMPARISON:  04/19/2023 FINDINGS: Lower chest: No acute findings are seen. Hepatobiliary: No focal abnormalities are seen in liver. Gallbladder is unremarkable. Pancreas: No focal abnormalities are seen in the pancreas. Spleen: Unremarkable. Adrenals/Urinary Tract: Adrenals are unremarkable. There is no hydronephrosis. There are patchy foci of decreased cortical enhancement in right kidney. There is homogeneous cortical enhancement in the left kidney. There are no renal or ureteral stones. Urinary bladder is unremarkable. Stomach/Bowel: Stomach is unremarkable. Proximal small bowel loops are not dilated. There is mild dilation of small bowel loops in  lower mid abdomen measuring up to 4 cm in diameter. Distal/terminal ileum is not dilated. Appendix is not seen. There are surgical staples in the pericecal region. There is no significant wall thickening and colon. Vascular/Lymphatic: Vascular structures are unremarkable. There are subcentimeter nodes in mesentery. Reproductive: Unremarkable. Other: Small amount of free fluid is seen in pelvis measuring 5.9 x 2.8 cm. There is interval decrease in size of this fluid in the pelvic cavity. There is minimal enhancement in the lateral margins of this  fluid collection. There are no internal septations or pockets of air. There is interval decrease in ascites in abdomen and pelvis. There is no pneumoperitoneum. Musculoskeletal: Degenerative changes are noted in lower lumbar spine with encroachment of neural foramina. IMPRESSION: Postsurgical changes are noted in pericecal region. There is dilation of few small bowel loops in lower mid abdomen, possibly suggesting ileus. There is 5.9 x 2.8 cm fluid collection in pelvic cavity without thick circumferential wall. There is interval decrease in amount of this fluid collection since 04/19/2023. Findings may suggest seroma or early abscess. Clinical observation and short-term follow-up CT as warranted may be considered. There are patchy areas of decreased cortical enhancement in right kidney. Findings suggest possible acute pyelonephritis. There is no hydronephrosis or perinephric fluid collection. There is no evidence of intestinal obstruction or pneumoperitoneum. Electronically Signed   By: Ernie Avena M.D.   On: 04/24/2023 17:51    Anti-infectives: Anti-infectives (From admission, onward)    Start     Dose/Rate Route Frequency Ordered Stop   04/21/23 1400  piperacillin-tazobactam (ZOSYN) IVPB 3.375 g  Status:  Discontinued        3.375 g 100 mL/hr over 30 Minutes Intravenous Every 8 hours 04/21/23 1105 04/21/23 1108   04/21/23 1200  piperacillin-tazobactam (ZOSYN) IVPB 3.375 g        3.375 g 12.5 mL/hr over 240 Minutes Intravenous Every 8 hours 04/21/23 1109     04/20/23 0800  metroNIDAZOLE (FLAGYL) IVPB 500 mg        500 mg 100 mL/hr over 60 Minutes Intravenous Every 12 hours 04/20/23 0011 04/20/23 1959   04/19/23 1930  cefTRIAXone (ROCEPHIN) 2 g in sodium chloride 0.9 % 100 mL IVPB       See Hyperspace for full Linked Orders Report.   2 g 200 mL/hr over 30 Minutes Intravenous  Once 04/19/23 1925 04/19/23 2125   04/19/23 1930  metroNIDAZOLE (FLAGYL) IVPB 500 mg       See Hyperspace for full  Linked Orders Report.   500 mg 100 mL/hr over 60 Minutes Intravenous  Once 04/19/23 1925 04/19/23 2117        Assessment/Plan Gangrenous/necrotic appendix without perforation  -POD#6 s/p laparoscopic appendectomy 4/19 Dr. Bedelia Person - CT 4/24 with mild ileus, 5.9 x 2.8 cm fluid collection in pelvis that is not organized, decrease in amount of fluid from CT 4/19 - WBC normalized, afebrile. - advance to soft diet, continue zosyn. Possible PM discharge pending tolerance of diet. Will discuss abx at discharge with MD. - ok to go outside   ID - rocephin/flagyl 4/19, zosyn 4/21>> day 5 FEN - soft VTE - SCDs, lovenox Foley - none and voiding   AKI - Cr 1.60 > 1.37 today. Unsure what his baseline is as Cr was elevated on labs several months ago, he had this worked up by PCP and had been working on diet changes, rec follow up with PCP after discharge    LOS: 5 days    Lanora Manis  Langley Adie, PA-C [facility] 04/26/2023, 10:29 AM Please see Amion for pager number during day hours 7:00am-4:30pm

## 2023-05-02 NOTE — Discharge Summary (Signed)
[facility] Discharge Summary   Patient ID: Henry Stuart MRN: [id number] DOB/AGE: 1981-01-29 41 y.o.  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  Procedures  Dr. Kris Mouton (04/19/23) - Laparoscopic Appendectomy  Hospital Course:  42 y/o M who presented to [facility] with abdominal pain.  Workup showed appendicitis with periappendiceal phlegmonous changes and some free fluid in the pevlis.  Patient was admitted and underwent procedure listed above where appendix was noted to be gangrenous.  Tolerated procedure well and was transferred to the floor.  He did have a post-op ileus. Repeat CT 4/24 showed some fluid in the pelvis (decreased compared to prior) without a thick circumferential wall and mildly dilated loops of small bowel. Diet was advanced as tolerated.  On POD#7, the patient was voiding well, tolerating diet, ambulating well, pain well controlled, vital signs stable, incisions c/d/i and felt stable for discharge home.  Patient will follow up in our office as below and knows to call with questions or concerns.  Of note, he had an increased creatinine during his admission but had been undergoing outpatient working up for this prior to his appendectomy/infection. Advised to follow up with PCP and voiced understanding.   Allergies as of 04/26/2023   No Known Allergies      Medication List     TAKE these medications    acetaminophen 500 MG tablet Commonly known as: TYLENOL Take 2 tablets (1,000 mg total) by mouth 4 (four)  times daily.   amoxicillin-clavulanate 875-125 MG tablet Commonly known as: AUGMENTIN Take 1 tablet by mouth 2 (two) times daily for 7 days.   docusate sodium 100 MG capsule Commonly known as: Colace Take 1 capsule (100 mg total) by mouth 2 (two) times daily.   ibuprofen 600 MG tablet Commonly known as: ADVIL Take 1 tablet (600 mg total) by mouth every 6 (six) hours as needed. What changed:  medication strength how much to take reasons to take this   methocarbamol 750 MG tablet Commonly known as: Robaxin-750 Take 1 tablet (750 mg total) by mouth 4 (four) times daily.   oxyCODONE 5 MG immediate release tablet Commonly known as: Roxicodone Take 1 tablet (5 mg total) by mouth every 4 (four) hours as needed.          Follow-up Information     [facility], [place]. Go on 05/14/2023.   Specialty: General Surgery Why: at 8:45am for follow up after your surgery. please arrive 20-30 minutes early. Contact information: [address] [place] [address] [phone]        [facility], [place]. Schedule an appointment as soon as possible for a visit.   Specialty: Physician Assistant Why: Call to arrange follow up with your primary care physician after discharge regarding elevated creatinine Contact information: [address] [place] [address] [phone]                 Signed: Hosie Spangle, [facility] 05/02/2023, 9:57 AM

## 2023-10-08 ENCOUNTER — Encounter: Payer: BC Managed Care – PPO | Admitting: Family Medicine

## 2023-10-08 ENCOUNTER — Encounter: Payer: Self-pay | Admitting: Family Medicine

## 2023-10-08 NOTE — Progress Notes (Deleted)
   Annual physical  Subjective   Patient ID: Henry Stuart, male    DOB: 1981-01-13  Age: 42 y.o. MRN: [id number]  No chief complaint on file.  HPI Henry Stuart is a 42 y.o. old male here  for annual exam.   Changes in his/her health in the last 12 months: {yes/no:63}  The patient currently works as a***/does not work***.  He is married/single/in a relationship and recently/not recently sexually active with ***male partner.  He does*** use tobacco***, drinks***days per***, does***use recreational drugs.  The patient eats a***diet.  He exercises***times per week doing***.  The patient does***have an advanced directive.  The patient declines/endorses concerns for impaired hearing.  The patient declines/endorses concerns for impaired vision.  The patient does/does not see a dentist regularly.  The patient does***have a family history of prostate cancer.  The patient does***have a family history of colon cancer.  The patient has the following chronic health issues that are monitored on a routine basis: ***  Anemia-normocytic-5 months-appendicitis  HPI  Separate, acute concerns today: ***  The ASCVD Risk score (Arnett DK, et al., 2019) failed to calculate for the following reasons:   The valid total cholesterol range is 130 to 320 mg/dL  Health Maintenance Due  Topic Date Due   Hepatitis C Screening  Never done   INFLUENZA VACCINE  Never done   COVID-19 Vaccine (1 - 2023-24 season) Never done      Objective:     There were no vitals taken for this visit. {Vitals History (Optional):23777}  Physical Exam   No results found for any visits on 10/08/23.      Assessment & Plan:   There are no diagnoses linked to this encounter.   No follow-ups on file.    [name], MD

## 2023-10-09 ENCOUNTER — Ambulatory Visit (INDEPENDENT_AMBULATORY_CARE_PROVIDER_SITE_OTHER): Payer: BC Managed Care – PPO | Admitting: Family Medicine

## 2023-10-09 VITALS — BP 107/63 | HR 63 | Ht 78.0 in | Wt 207.0 lb

## 2023-10-09 DIAGNOSIS — I491 Atrial premature depolarization: Secondary | ICD-10-CM | POA: Diagnosis not present

## 2023-10-09 DIAGNOSIS — R7989 Other specified abnormal findings of blood chemistry: Secondary | ICD-10-CM | POA: Diagnosis not present

## 2023-10-09 DIAGNOSIS — D649 Anemia, unspecified: Secondary | ICD-10-CM | POA: Diagnosis not present

## 2023-10-09 NOTE — Assessment & Plan Note (Signed)
Creatinine was elevated during his hospitalization.  Patient states that his creatinine has "always been elevated".  Will get repeat of CMP and also order a cystatin C to further help differentiate if he has CKD.  Does not have a history of diabetes or hypertension.

## 2023-10-09 NOTE — Assessment & Plan Note (Signed)
On exam his heart rate was irregular.  Chart review of his previous EKG from hospitalization showed PACs.  EKG obtained today shows PACs.  Patient asymptomatic.  Reports a history of "murmur" due to a valvular issue as a teenager while he was in [place] but has not had any further evaluation since that time.  Discussed with patient that PACs if they are asymptomatic may not need treatment.  Offered him option of referral to cardiology due to the frequency of his PACs and the history of this unknown valvular issue, or a order for at home cardiac monitoring with Zio patch, or continued monitoring without workup.  Patient will consider his options and let us know if he would like to pursue any of them.

## 2023-10-09 NOTE — Assessment & Plan Note (Signed)
Anemia noted on his lab work from his hospitalization for appendicitis.  Likely blood loss related to surgery.  Will need to recheck CBC to make sure this has normalized.

## 2023-10-09 NOTE — Progress Notes (Signed)
Annual physical  Subjective   Patient ID: Henry Stuart, male    DOB: [birth year]  Age: 42 y.o. MRN: [id number]  Chief Complaint  Patient presents with   Annual Exam   HPI Henry Stuart is a 42 y.o. old male here  for annual exam.   Since his last physical the patient had an appendectomy for appendicitis and April.  He has recovered well from that.  He still gets some occasional abdominal pain if working out since his surgery.  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  He is married, has 3 children ages 57 and 62.  He does not use alcohol, drinks rarely, does not use recreational drugs.  Eats a regular diet.  Exercises regularly by playing basketball.  Patient states he has long had "elevated creatinine".  Has never been told he had kidney disease.  We discussed rechecking his kidney function, potassium and his CBC for anemia since the last results were from the hospitalization and we need to make sure they have normalized.  Patient states that he was diagnosed with a "heart murmur" when he was a child and had to be monitored till he was age 60.  States he was told he had a "leaky valve" that would eventually get better.  Has not had any evaluation of this since he was a child.  This was all performed in [place].   The ASCVD Risk score (Arnett DK, et al., 2019) failed to calculate for the following reasons:   The valid total cholesterol range is 130 to 320 mg/dL  Health Maintenance Due  Topic Date Due   Hepatitis C Screening  Never done   COVID-19 Vaccine (1 - 2023-24 season) Never done      Objective:     BP 107/63   Pulse 63   Ht 6\' 6"  (1.981 m)   Wt 207 lb (93.9 kg)   SpO2 98%   BMI 23.92 kg/m    Physical Exam General: Alert, oriented HEENT: PERRLA, EOMI, moist mucous CV: Bigeminal beat pattern appreciated.  No murmur. Pulmonary: Lungs clear bilaterally GI: Soft, normal bowel sounds MSK: Strength equal bilaterally normal gait. Extremities: No  pedal edema Psych: Pleasant affect Skin no rashes.   No results found for any visits on 10/09/23.      Assessment & Plan:   Premature atrial complex Assessment & Plan: On exam his heart rate was irregular.  Chart review of his previous EKG from hospitalization showed PACs.  EKG obtained today shows PACs.  Patient asymptomatic.  Reports a history of "murmur" due to a valvular issue as a teenager while he was in [place] but has not had any further evaluation since that time.  Discussed with patient that PACs if they are asymptomatic may not need treatment.  Offered him option of referral to cardiology due to the frequency of his PACs and the history of this unknown valvular issue, or a order for at home cardiac monitoring with Zio patch, or continued monitoring without workup.  Patient will consider his options and let us know if he would like to pursue any of them.  Orders: -     EKG 12-Lead  Anemia, unspecified type Assessment & Plan: Anemia noted on his lab work from his hospitalization for appendicitis.  Likely blood loss related to surgery.  Will need to recheck CBC to make sure this has normalized.  Orders: -     CBC; Future  Elevated serum creatinine Assessment & Plan: Creatinine  was elevated during his hospitalization.  Patient states that his creatinine has "always been elevated".  Will get repeat of CMP and also order a cystatin C to further help differentiate if he has CKD.  Does not have a history of diabetes or hypertension.  Orders: -     Comprehensive metabolic panel; Future -     Cystatin C; Future     Return in about 1 year (around 10/08/2024) for physical.    Henry Kitty, MD

## 2023-10-09 NOTE — Patient Instructions (Addendum)
It was nice to see you today,  We addressed the following topics today: -I have ordered some blood test that you will need to have done at your earliest convenience. -You have something called premature atrial complexes.  If these are not causing symptoms such as shortness of breath, chest pain, dizziness you do not need treatment for further workup for most cases.  If you would like further workup we can either have you wear a heart monitor patch for a week or send you to the cardiologist for further workup.  If you decide on 1 of those 2 options let us know.   Have a great day,  Frederic Jericho, MD

## 2023-10-10 ENCOUNTER — Other Ambulatory Visit: Payer: BC Managed Care – PPO

## 2023-10-10 DIAGNOSIS — D649 Anemia, unspecified: Secondary | ICD-10-CM

## 2023-10-10 DIAGNOSIS — R7989 Other specified abnormal findings of blood chemistry: Secondary | ICD-10-CM

## 2023-10-11 LAB — CBC
Hematocrit: 42.5 % (ref 37.5–51.0)
Hemoglobin: 13.7 g/dL (ref 13.0–17.7)
MCH: 31.1 pg (ref 26.6–33.0)
MCHC: 32.2 g/dL (ref 31.5–35.7)
MCV: 97 fL (ref 79–97)
Platelets: 256 10*3/uL (ref 150–450)
RBC: 4.4 x10E6/uL (ref 4.14–5.80)
RDW: 11.9 % (ref 11.6–15.4)
WBC: 5.6 10*3/uL (ref 3.4–10.8)

## 2023-10-11 LAB — COMPREHENSIVE METABOLIC PANEL
ALT: 13 [IU]/L (ref 0–44)
AST: 20 [IU]/L (ref 0–40)
Albumin: 4.6 g/dL (ref 4.1–5.1)
Alkaline Phosphatase: 54 [IU]/L (ref 44–121)
BUN/Creatinine Ratio: 8 — ABNORMAL LOW (ref 9–20)
BUN: 11 mg/dL (ref 6–24)
Bilirubin Total: 0.6 mg/dL (ref 0.0–1.2)
CO2: 28 mmol/L (ref 20–29)
Calcium: 9.7 mg/dL (ref 8.7–10.2)
Chloride: 100 mmol/L (ref 96–106)
Creatinine, Ser: 1.4 mg/dL — ABNORMAL HIGH (ref 0.76–1.27)
Globulin, Total: 2.7 g/dL (ref 1.5–4.5)
Glucose: 62 mg/dL — ABNORMAL LOW (ref 70–99)
Potassium: 4.5 mmol/L (ref 3.5–5.2)
Sodium: 139 mmol/L (ref 134–144)
Total Protein: 7.3 g/dL (ref 6.0–8.5)
eGFR: 65 mL/min/{1.73_m2} (ref >59)

## 2023-10-11 LAB — CYSTATIN C: CYSTATIN C: 0.85 mg/L (ref 0.60–1.00)

## 2024-10-02 ENCOUNTER — Other Ambulatory Visit: Payer: Self-pay | Admitting: Family Medicine

## 2024-10-02 DIAGNOSIS — E559 Vitamin D deficiency, unspecified: Secondary | ICD-10-CM

## 2024-10-02 DIAGNOSIS — Z13220 Encounter for screening for lipoid disorders: Secondary | ICD-10-CM

## 2024-10-02 DIAGNOSIS — R7989 Other specified abnormal findings of blood chemistry: Secondary | ICD-10-CM

## 2024-10-07 ENCOUNTER — Other Ambulatory Visit: Payer: Self-pay

## 2024-10-07 DIAGNOSIS — R7989 Other specified abnormal findings of blood chemistry: Secondary | ICD-10-CM

## 2024-10-07 DIAGNOSIS — Z13220 Encounter for screening for lipoid disorders: Secondary | ICD-10-CM

## 2024-10-07 DIAGNOSIS — E559 Vitamin D deficiency, unspecified: Secondary | ICD-10-CM

## 2024-10-08 ENCOUNTER — Encounter: Payer: BC Managed Care – PPO | Admitting: Family Medicine

## 2024-10-08 ENCOUNTER — Other Ambulatory Visit

## 2024-10-09 ENCOUNTER — Ambulatory Visit: Payer: Self-pay

## 2024-10-09 LAB — BASIC METABOLIC PANEL WITH GFR
BUN/Creatinine Ratio: 10 (ref 9–20)
BUN: 15 mg/dL (ref 6–24)
CO2: 27 mmol/L (ref 20–29)
Calcium: 9.6 mg/dL (ref 8.7–10.2)
Chloride: 99 mmol/L (ref 96–106)
Creatinine, Ser: 1.55 mg/dL — ABNORMAL HIGH (ref 0.76–1.27)
Glucose: 90 mg/dL (ref 70–99)
Potassium: 4.2 mmol/L (ref 3.5–5.2)
Sodium: 139 mmol/L (ref 134–144)
eGFR: 57 mL/min/1.73 — ABNORMAL LOW (ref >59)

## 2024-10-09 LAB — LIPID PANEL
Chol/HDL Ratio: 2.7 ratio (ref 0.0–5.0)
Cholesterol, Total: 148 mg/dL (ref 100–199)
HDL: 55 mg/dL (ref >39)
LDL Chol Calc (NIH): 79 mg/dL (ref 0–99)
Triglycerides: 74 mg/dL (ref 0–149)
VLDL Cholesterol Cal: 14 mg/dL (ref 5–40)

## 2024-10-09 LAB — VITAMIN D 25 HYDROXY (VIT D DEFICIENCY, FRACTURES): Vit D, 25-Hydroxy: 10.7 ng/mL — ABNORMAL LOW (ref 30.0–100.0)

## 2024-10-09 LAB — CYSTATIN C: CYSTATIN C: 0.82 mg/L (ref 0.60–1.00)

## 2024-10-09 MED ORDER — CHOLECALCIFEROL 1.25 MG (50000 UT) PO TABS: 50000.0000 [IU] | ORAL_TABLET | ORAL | 0 refills | Status: DC

## 2024-10-14 ENCOUNTER — Ambulatory Visit (INDEPENDENT_AMBULATORY_CARE_PROVIDER_SITE_OTHER): Admitting: Family Medicine

## 2024-10-14 ENCOUNTER — Encounter: Payer: Self-pay | Admitting: Family Medicine

## 2024-10-14 VITALS — BP 119/71 | HR 60 | Ht 78.0 in | Wt 212.1 lb

## 2024-10-14 DIAGNOSIS — E559 Vitamin D deficiency, unspecified: Secondary | ICD-10-CM | POA: Diagnosis not present

## 2024-10-14 DIAGNOSIS — R5383 Other fatigue: Secondary | ICD-10-CM

## 2024-10-14 DIAGNOSIS — I491 Atrial premature depolarization: Secondary | ICD-10-CM

## 2024-10-14 DIAGNOSIS — R7989 Other specified abnormal findings of blood chemistry: Secondary | ICD-10-CM | POA: Diagnosis not present

## 2024-10-14 DIAGNOSIS — Z0000 Encounter for general adult medical examination without abnormal findings: Secondary | ICD-10-CM | POA: Diagnosis not present

## 2024-10-14 NOTE — Patient Instructions (Addendum)
 It was nice to see you today,  We addressed the following topics today: - Please finish the 12-week course of your prescription vitamin D . - After you finish the prescription, I would like you to start taking an over-the-counter vitamin D3 supplement. You can take 2,000 units daily, but do not take more than 4,000 units per day. - We will recheck your labs in about three months. If they are normal and you are still fatigued we can send in a referral for a sleep study.  - If you start to experience any chest pain, shortness of breath, or a persistent feeling of your heart fluttering or beating irregularly, please schedule an acute visit with us .   Have a great day,  Rolan Slain, MD

## 2024-10-14 NOTE — Progress Notes (Signed)
 Annual physical  Subjective   Patient ID: Henry Stuart, male    DOB: 1981/09/22  Age: 43 y.o. MRN: [id number]  Chief Complaint  Patient presents with   Annual Exam   HPI Henry Stuart is a 43 y.o. old male here  for annual exam.   Subjective - Follow-up visit. Reports no new concerns other than recent fatigue. - Reports recent fatigue. Considers it may be related to low vitamin D , as they had similar symptoms in the past when vitamin D  was low. Denies chest pain, shortness of breath, or feeling of heart beating irregularly. Reports snoring sometimes. Acknowledges occasionally feeling the urge to sleep when sitting down but has always taken a daily nap during their lunch break. Notes being more tired than usual recently. - History of premature atrial complexes (PACs). Not currently concerned. Had a heart murmur as a teenager which self-resolved. No current cardiac symptoms. - Denies new issues since last visit.  Medications: Currently taking prescription weekly high-dose vitamin D  for recently diagnosed deficiency. Previously took vitamin D  approximately 5 years ago for a prior deficiency.  PMH, PSH, FH, Social Hx: PMHx: Vitamin D  deficiency, elevated creatinine with normal cystatin C, premature atrial complexes, history of heart murmur in adolescence (resolved). PSH: Appendectomy. No other surgeries. 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 Reports being active throughout the day, walking over 7,000-10,000 steps daily. Does not exercise as regularly as desired.  ROS: Constitutional: Positive for fatigue. Cardiovascular: Negative for chest pain, shortness of breath, palpitations. Reports occasional snoring.     The 10-year ASCVD risk score (Arnett DK, et al., 2019) is: 0.6%  Health Maintenance Due  Topic Date Due   Hepatitis C Screening  Never done   Hepatitis B Vaccines 19-59 Average Risk (1 of 3 - 19+ 3-dose series) Never done   HPV VACCINES (1 -  3-dose SCDM series) Never done   COVID-19 Vaccine (1 - 2025-26 season) Never done      Objective:     BP 119/71   Pulse 60   Ht 6' 6 (1.981 m)   Wt 212 lb 1.9 oz (96.2 kg)   SpO2 99%   BMI 24.51 kg/m    Physical Exam Gen: alert, oriented HEENT: perrla, eomi, mmm CV: rrr, no murmur Pulm: lctab. No wheeze or crackles.  GI: soft, nbs.  Nontender to palpation MSK: strength equal b/l. Normal gait Ext: no pedal edema Skin: warm and dry, no rashes Psych: pleasant affect.  Spontaneous speech   No results found for any visits on 10/14/24.      Assessment & Plan:   Physical exam, annual  Vitamin D  deficiency Assessment & Plan: Diagnosed on recent labs. Patient reports associated fatigue, similar to a previous episode of deficiency. Currently on a 12-week course of high-dose prescription vitamin D .     - Continue weekly prescription vitamin D  for the full 12-week course.     - After completion, switch to over-the-counter vitamin D3, 2000-4000 units daily.     - Recheck vitamin D  level in 3 months. If fatigue persists at that time, will also check TSH, B12, and iron levels.   Other fatigue Assessment & Plan:  Likely secondary to vitamin D  deficiency. Other potential etiologies include sleep apnea, thyroid dysfunction, anemia, or stress. Patient reports snoring and daytime sleepiness.     - Primary plan is to replete vitamin D  and reassess symptoms in 3 months.     - If fatigue does not  resolve, will proceed with further laboratory workup as above.     - Discussed sleep study as a next step if labs are unrevealing and symptoms persist, to evaluate for sleep apnea.  Orders: -     VITAMIN D  25 Hydroxy (Vit-D Deficiency, Fractures); Future -     B12 and Folate Panel; Future -     TSH; Future  Premature atrial complex Assessment & Plan: History of PACs on EKG last year. Asymptomatic at present. Denies chest pain, dyspnea, or palpitations.     - Monitor clinically.     -  Will obtain annual EKG for monitoring.  Orders: -     EKG 12-Lead  Elevated serum creatinine Assessment & Plan: History of elevated creatinine with a normal cystatin C, indicating cystatin C is a more accurate marker of renal function for this individual.     - Continue monitoring renal function with both creatinine and cystatin C in the future.      Return in about 1 year (around 10/14/2025) for physical.    Toribio MARLA Slain, MD

## 2024-10-18 DIAGNOSIS — R5383 Other fatigue: Secondary | ICD-10-CM | POA: Insufficient documentation

## 2024-10-18 DIAGNOSIS — E559 Vitamin D deficiency, unspecified: Secondary | ICD-10-CM | POA: Insufficient documentation

## 2024-10-18 NOTE — Assessment & Plan Note (Signed)
 History of PACs on EKG last year. Asymptomatic at present. Denies chest pain, dyspnea, or palpitations.     - Monitor clinically.     - Will obtain annual EKG for monitoring.

## 2024-10-18 NOTE — Assessment & Plan Note (Signed)
 History of elevated creatinine with a normal cystatin C, indicating cystatin C is a more accurate marker of renal function for this individual.     - Continue monitoring renal function with both creatinine and cystatin C in the future.

## 2024-10-18 NOTE — Assessment & Plan Note (Signed)
 Diagnosed on recent labs. Patient reports associated fatigue, similar to a previous episode of deficiency. Currently on a 12-week course of high-dose prescription vitamin D .     - Continue weekly prescription vitamin D  for the full 12-week course.     - After completion, switch to over-the-counter vitamin D3, 2000-4000 units daily.     - Recheck vitamin D  level in 3 months. If fatigue persists at that time, will also check TSH, B12, and iron levels.

## 2024-10-18 NOTE — Assessment & Plan Note (Signed)
 Likely secondary to vitamin D  deficiency. Other potential etiologies include sleep apnea, thyroid dysfunction, anemia, or stress. Patient reports snoring and daytime sleepiness.     - Primary plan is to replete vitamin D  and reassess symptoms in 3 months.     - If fatigue does not resolve, will proceed with further laboratory workup as above.     - Discussed sleep study as a next step if labs are unrevealing and symptoms persist, to evaluate for sleep apnea.

## 2024-10-19 ENCOUNTER — Encounter: Admitting: Family Medicine

## 2024-12-28 ENCOUNTER — Other Ambulatory Visit: Payer: Self-pay

## 2025-01-14 ENCOUNTER — Other Ambulatory Visit

## 2025-01-14 DIAGNOSIS — R5383 Other fatigue: Secondary | ICD-10-CM

## 2025-01-15 ENCOUNTER — Ambulatory Visit: Payer: Self-pay | Admitting: Family Medicine

## 2025-01-15 LAB — TSH: TSH: 1.63 u[IU]/mL (ref 0.450–4.500)

## 2025-01-15 LAB — B12 AND FOLATE PANEL
Folate: 8 ng/mL
Vitamin B-12: 411 pg/mL (ref 232–1245)

## 2025-01-15 LAB — VITAMIN D 25 HYDROXY (VIT D DEFICIENCY, FRACTURES): Vit D, 25-Hydroxy: 66.7 ng/mL (ref 30.0–100.0)

## 2025-10-08 ENCOUNTER — Other Ambulatory Visit

## 2025-10-15 ENCOUNTER — Encounter: Admitting: Family Medicine
